# Patient Record
Sex: Male | Born: 1985 | Race: White | Hispanic: No | Marital: Married | Smoking: Current every day smoker
Health system: Southern US, Community
[De-identification: ages and names within clinical notes are randomized; demographics above are authoritative.]

## PROBLEM LIST (undated history)

## (undated) DIAGNOSIS — G43909 Migraine, unspecified, not intractable, without status migrainosus: Secondary | ICD-10-CM

## (undated) DIAGNOSIS — F32A Depression, unspecified: Secondary | ICD-10-CM

## (undated) DIAGNOSIS — F329 Major depressive disorder, single episode, unspecified: Secondary | ICD-10-CM

## (undated) HISTORY — PX: OTHER SURGICAL HISTORY: SHX169

---

## 2004-09-11 ENCOUNTER — Emergency Department (HOSPITAL_COMMUNITY): Admission: EM | Admit: 2004-09-11 | Discharge: 2004-09-11 | Payer: Self-pay | Admitting: Emergency Medicine

## 2005-09-08 ENCOUNTER — Emergency Department (HOSPITAL_COMMUNITY): Admission: EM | Admit: 2005-09-08 | Discharge: 2005-09-08 | Payer: Self-pay | Admitting: Emergency Medicine

## 2006-11-30 ENCOUNTER — Emergency Department (HOSPITAL_COMMUNITY): Admission: EM | Admit: 2006-11-30 | Discharge: 2006-11-30 | Payer: Self-pay | Admitting: Family Medicine

## 2006-12-05 ENCOUNTER — Ambulatory Visit: Payer: Self-pay | Admitting: Critical Care Medicine

## 2007-01-05 ENCOUNTER — Ambulatory Visit: Payer: Self-pay | Admitting: Critical Care Medicine

## 2007-03-29 ENCOUNTER — Emergency Department (HOSPITAL_COMMUNITY): Admission: EM | Admit: 2007-03-29 | Discharge: 2007-03-29 | Payer: Self-pay | Admitting: Emergency Medicine

## 2007-05-31 DIAGNOSIS — Z9189 Other specified personal risk factors, not elsewhere classified: Secondary | ICD-10-CM | POA: Insufficient documentation

## 2007-05-31 DIAGNOSIS — J189 Pneumonia, unspecified organism: Secondary | ICD-10-CM

## 2007-05-31 DIAGNOSIS — F341 Dysthymic disorder: Secondary | ICD-10-CM

## 2008-07-20 ENCOUNTER — Emergency Department (HOSPITAL_BASED_OUTPATIENT_CLINIC_OR_DEPARTMENT_OTHER): Admission: EM | Admit: 2008-07-20 | Discharge: 2008-07-20 | Payer: Self-pay | Admitting: Emergency Medicine

## 2008-10-21 ENCOUNTER — Emergency Department (HOSPITAL_BASED_OUTPATIENT_CLINIC_OR_DEPARTMENT_OTHER): Admission: EM | Admit: 2008-10-21 | Discharge: 2008-10-21 | Payer: Self-pay | Admitting: Emergency Medicine

## 2008-10-28 ENCOUNTER — Ambulatory Visit: Payer: Self-pay | Admitting: Diagnostic Radiology

## 2008-10-28 ENCOUNTER — Emergency Department (HOSPITAL_BASED_OUTPATIENT_CLINIC_OR_DEPARTMENT_OTHER): Admission: EM | Admit: 2008-10-28 | Discharge: 2008-10-28 | Payer: Self-pay | Admitting: Emergency Medicine

## 2009-03-26 ENCOUNTER — Emergency Department (HOSPITAL_BASED_OUTPATIENT_CLINIC_OR_DEPARTMENT_OTHER): Admission: EM | Admit: 2009-03-26 | Discharge: 2009-03-27 | Payer: Self-pay | Admitting: Emergency Medicine

## 2009-03-26 ENCOUNTER — Ambulatory Visit: Payer: Self-pay | Admitting: Diagnostic Radiology

## 2009-07-24 ENCOUNTER — Emergency Department (HOSPITAL_BASED_OUTPATIENT_CLINIC_OR_DEPARTMENT_OTHER): Admission: EM | Admit: 2009-07-24 | Discharge: 2009-07-24 | Payer: Self-pay | Admitting: Emergency Medicine

## 2009-09-10 ENCOUNTER — Emergency Department (HOSPITAL_BASED_OUTPATIENT_CLINIC_OR_DEPARTMENT_OTHER): Admission: EM | Admit: 2009-09-10 | Discharge: 2009-09-11 | Payer: Self-pay | Admitting: Emergency Medicine

## 2009-09-11 ENCOUNTER — Ambulatory Visit: Payer: Self-pay | Admitting: Diagnostic Radiology

## 2010-04-29 ENCOUNTER — Emergency Department (HOSPITAL_COMMUNITY)
Admission: EM | Admit: 2010-04-29 | Discharge: 2010-04-29 | Payer: Self-pay | Source: Home / Self Care | Admitting: Emergency Medicine

## 2010-05-24 ENCOUNTER — Encounter: Payer: Self-pay | Admitting: Specialist

## 2010-07-13 LAB — RAPID STREP SCREEN (MED CTR MEBANE ONLY): Streptococcus, Group A Screen (Direct): NEGATIVE

## 2010-07-21 LAB — CBC
MCV: 89.8 fL (ref 78.0–100.0)
RBC: 5.19 MIL/uL (ref 4.22–5.81)
WBC: 14.1 10*3/uL — ABNORMAL HIGH (ref 4.0–10.5)

## 2010-07-21 LAB — DIFFERENTIAL
Basophils Absolute: 0 10*3/uL (ref 0.0–0.1)
Eosinophils Absolute: 0.1 10*3/uL (ref 0.0–0.7)
Eosinophils Relative: 1 % (ref 0–5)
Lymphocytes Relative: 12 % (ref 12–46)
Lymphs Abs: 1.7 10*3/uL (ref 0.7–4.0)
Monocytes Relative: 5 % (ref 3–12)
Neutrophils Relative %: 82 % — ABNORMAL HIGH (ref 43–77)

## 2010-07-21 LAB — D-DIMER, QUANTITATIVE: D-Dimer, Quant: <0.22 ug/mL-FEU (ref 0.00–0.48)

## 2010-07-21 LAB — BASIC METABOLIC PANEL
BUN: 11 mg/dL (ref 6–23)
CO2: 26 mEq/L (ref 19–32)
GFR calc Af Amer: >60 mL/min (ref >60)
Glucose, Bld: 103 mg/dL — ABNORMAL HIGH (ref 70–99)
Sodium: 143 mEq/L (ref 135–145)

## 2010-07-21 LAB — LITHIUM LEVEL: Lithium Lvl: <0.25 mEq/L — ABNORMAL LOW (ref 0.80–1.40)

## 2010-08-07 ENCOUNTER — Emergency Department (INDEPENDENT_AMBULATORY_CARE_PROVIDER_SITE_OTHER): Payer: Self-pay

## 2010-08-07 ENCOUNTER — Emergency Department (HOSPITAL_BASED_OUTPATIENT_CLINIC_OR_DEPARTMENT_OTHER)
Admission: EM | Admit: 2010-08-07 | Discharge: 2010-08-08 | Disposition: A | Discharge status: Home / Self Care | Payer: Self-pay | Attending: Emergency Medicine | Admitting: Emergency Medicine

## 2010-08-07 DIAGNOSIS — F17200 Nicotine dependence, unspecified, uncomplicated: Secondary | ICD-10-CM | POA: Insufficient documentation

## 2010-08-07 DIAGNOSIS — R51 Headache: Secondary | ICD-10-CM

## 2010-08-16 ENCOUNTER — Emergency Department (INDEPENDENT_AMBULATORY_CARE_PROVIDER_SITE_OTHER): Payer: Self-pay

## 2010-08-16 ENCOUNTER — Emergency Department (HOSPITAL_BASED_OUTPATIENT_CLINIC_OR_DEPARTMENT_OTHER)
Admission: EM | Admit: 2010-08-16 | Discharge: 2010-08-16 | Disposition: A | Discharge status: Home / Self Care | Payer: Self-pay | Attending: Emergency Medicine | Admitting: Emergency Medicine

## 2010-08-16 DIAGNOSIS — F17200 Nicotine dependence, unspecified, uncomplicated: Secondary | ICD-10-CM | POA: Insufficient documentation

## 2010-08-16 DIAGNOSIS — IMO0002 Reserved for concepts with insufficient information to code with codable children: Secondary | ICD-10-CM

## 2010-08-16 DIAGNOSIS — R071 Chest pain on breathing: Secondary | ICD-10-CM

## 2010-08-16 DIAGNOSIS — S20219A Contusion of unspecified front wall of thorax, initial encounter: Secondary | ICD-10-CM | POA: Insufficient documentation

## 2010-08-16 DIAGNOSIS — Y9364 Activity, baseball: Secondary | ICD-10-CM | POA: Insufficient documentation

## 2010-08-16 DIAGNOSIS — W219XXA Striking against or struck by unspecified sports equipment, initial encounter: Secondary | ICD-10-CM | POA: Insufficient documentation

## 2010-08-24 ENCOUNTER — Emergency Department (INDEPENDENT_AMBULATORY_CARE_PROVIDER_SITE_OTHER): Payer: Self-pay

## 2010-08-24 ENCOUNTER — Emergency Department (HOSPITAL_BASED_OUTPATIENT_CLINIC_OR_DEPARTMENT_OTHER)
Admission: EM | Admit: 2010-08-24 | Discharge: 2010-08-24 | Disposition: A | Discharge status: Home / Self Care | Payer: Self-pay | Attending: Emergency Medicine | Admitting: Emergency Medicine

## 2010-08-24 DIAGNOSIS — R079 Chest pain, unspecified: Secondary | ICD-10-CM | POA: Insufficient documentation

## 2010-08-24 DIAGNOSIS — F17200 Nicotine dependence, unspecified, uncomplicated: Secondary | ICD-10-CM | POA: Insufficient documentation
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
## 2010-09-15 NOTE — Assessment & Plan Note (Signed)
[facility]                             PULMONARY OFFICE NOTE   Donald Serrano, Donald Serrano                     MRN:          [id number]  DATE:01/05/2007                            DOB:          04/06/1986    Donald Serrano returns in followup.  This 25 year old male smoker with  right lower lobe pneumonia.  He has improved with his breathing but  still is smoking actively.  He has no cough or congestion.  Does note  increased anxiety attacks with spells of crying and depression.   PHYSICAL EXAMINATION:  Temp 97.9, blood pressure 118/78, pulse 88,  saturation 99% on room air.  CHEST:  Completely clear without evidence of adventitious breath sounds.  There was no evidence of wheeze, rales, or rhonchi noted.  CARDIAC:  Regular rate and rhythm without S3.  Normal S1 and S2.  ABDOMEN:  Soft, nontender.  EXTREMITIES:  No clubbing or edema.   Chest x-ray obtained today showed clearance of right lower lobe  infiltrate.   IMPRESSION:  Chronic active smoking, previous pneumonia, now resolved.   PLAN:  Pursue smoking cessation with Nicotrol inhaler.  He will also  receive Lexapro for active depression, 10 mg daily, and will refer the  patient to primary care to establish.     Charlcie Cradle Delford Field, MD, [facility]  Electronically Signed    PEW/MedQ  DD: 01/05/2007  DT: 01/05/2007  Job #: [id number]

## 2011-02-15 LAB — POCT URINALYSIS DIP (DEVICE)
Hgb urine dipstick: NEGATIVE
Ketones, ur: NEGATIVE
Operator id: 247071
Protein, ur: NEGATIVE
Specific Gravity, Urine: 1.015
Urobilinogen, UA: 1
pH: 8.5 — ABNORMAL HIGH

## 2011-02-15 LAB — CBC
Hemoglobin: 16.5
MCHC: 33.6
MCV: 88.6
RBC: 5.54
WBC: 7.6

## 2011-02-15 LAB — I-STAT 8, (EC8 V) (CONVERTED LAB)
BUN: 11
Bicarbonate: 27.3 — ABNORMAL HIGH
Chloride: 105
Glucose, Bld: 88
HCT: 53 — ABNORMAL HIGH
pCO2, Ven: 44.1 — ABNORMAL LOW
pH, Ven: 7.4 — ABNORMAL HIGH

## 2011-02-15 LAB — DIFFERENTIAL
Basophils Relative: 0
Eosinophils Absolute: 0.1
Lymphs Abs: 1.8
Monocytes Absolute: 0.6
Monocytes Relative: 8
Neutrophils Relative %: 68

## 2011-02-15 LAB — POCT I-STAT CREATININE: Creatinine, Ser: 0.9

## 2011-11-13 ENCOUNTER — Emergency Department (HOSPITAL_COMMUNITY)
Admission: EM | Admit: 2011-11-13 | Discharge: 2011-11-14 | Disposition: A | Discharge status: Home / Self Care | Payer: Self-pay | Attending: Emergency Medicine | Admitting: Emergency Medicine

## 2011-11-13 ENCOUNTER — Encounter (HOSPITAL_COMMUNITY): Payer: Self-pay | Admitting: Emergency Medicine

## 2011-11-13 ENCOUNTER — Emergency Department (HOSPITAL_COMMUNITY): Payer: Self-pay

## 2011-11-13 DIAGNOSIS — L03039 Cellulitis of unspecified toe: Secondary | ICD-10-CM | POA: Insufficient documentation

## 2011-11-13 DIAGNOSIS — L02619 Cutaneous abscess of unspecified foot: Secondary | ICD-10-CM | POA: Insufficient documentation

## 2011-11-13 NOTE — ED Notes (Signed)
Pt states he had a had a bump on his toe several days ago. Pt states he popped it. Second digit on right foot is red and swollen with puss draining. Pt pain 10/10.

## 2011-11-14 MED ORDER — OXYCODONE-ACETAMINOPHEN 5-325 MG PO TABS
1.0000 | ORAL_TABLET | Freq: Once | ORAL | Status: AC
  Administered 2011-11-14: 1 via ORAL
  Filled 2011-11-14: qty 1

## 2011-11-14 MED ORDER — SULFAMETHOXAZOLE-TRIMETHOPRIM 800-160 MG PO TABS: 1.0000 | ORAL_TABLET | Freq: Two times a day (BID) | ORAL | Status: DC

## 2011-11-14 MED ORDER — OXYCODONE-ACETAMINOPHEN 5-325 MG PO TABS: 1.0000 | ORAL_TABLET | ORAL | Status: DC | PRN

## 2011-11-14 NOTE — ED Notes (Signed)
Patient given discharge instructions, information, prescriptions, and diet order. Patient states that they adequately understand discharge information given and to return to ED if symptoms return or worsen.     

## 2011-11-14 NOTE — ED Provider Notes (Signed)
History     CSN: [id number]  Arrival date & time 11/13/11  2135   First MD Initiated Contact with Patient 11/14/11 0117      Chief Complaint  Patient presents with  . Foot Pain    (Consider location/radiation/quality/duration/timing/severity/associated sxs/prior treatment) HPI Comments: Donald Serrano first started noticing redness, swelling, and pain in the 3rd toe of his left foot 1 week ago. The pain is worsened by touch and weightbearing. He noticed some drainage from the site 3 days ago. He notes that his father in law, whom he lives with, recently had a similar lesion in his armpit. He denies fever, chills, and malaise. He denies trauma to his foot and exposure to insects. He denies numbness and tingling in his left foot and toes.  His most recent tetanus vaccination was in 2010.  The history is provided by the patient.    No past medical history on file.  No past surgical history on file.  No family history on file.  History  Substance Use Topics  . Smoking status: Current Everyday Smoker -- 2.0 packs/day  . Smokeless tobacco: Not on file  . Alcohol Use: No      Review of Systems  Constitutional: Negative for fever and chills.  Neurological: Negative for weakness and numbness.    Allergies  Review of patient's allergies indicates no known allergies.  Home Medications  No current outpatient prescriptions on file.  BP 118/83  Pulse 106  Temp 99 F (37.2 C) (Oral)  Resp 20  SpO2 100%  Physical Exam  Nursing note and vitals reviewed. Constitutional: He is oriented to person, place, and time. He appears well-developed and well-nourished. No distress.  HENT:  Head: Normocephalic and atraumatic.  Neck: Neck supple.  Pulmonary/Chest: Effort normal.  Musculoskeletal:       Feet:       Pt able to move toe though decreased due to swelling and pain.  Capillary refill < 2 seconds.  Others digits with full AROM.  No erythema extending past the toe.      Neurological: He is alert and oriented to person, place, and time.  Skin: He is not diaphoretic.    ED Course  Procedures (including critical care time)  Labs Reviewed - No data to display Dg Foot Complete Right  11/13/2011  *RADIOLOGY REPORT*  Clinical Data: Left foot swelling and redness.  No known injury.  RIGHT FOOT COMPLETE - 3+ VIEW  Comparison: None.  Findings: No fracture or dislocation.  No aggressive osseous lesion.  No radiopaque foreign body.  Lisfranc joint appears intact. There does appear to be mild dorsal and lateral soft tissue swelling.  IMPRESSION: No acute osseous abnormality identified. Mild soft tissue swelling.  Original Report Authenticated By: Waneta Martins, M.D.   Patient also seen and examined by Dr Bebe Shaggy.  Discussed plan with Dr Bebe Shaggy.   1. Cellulitis of toe       MDM  Pt with cellulitis/abscess of toe, actively draining.  Pt is nontoxic, afebrile.  Pt d/c home with bactrim, percocet, instructions for warm soaks, ED follow up in 2 days, return precautions given.  Patient verbalizes understanding and agrees with plan.         Donald Serrano, [place] 11/14/11 [phone]

## 2011-11-14 NOTE — ED Notes (Signed)
Pt sts on Sunday 11/07/11 he felt chills and generalized weakness, took bath and noticed red raised area on his right 2nd digit Monday. Sts Wednesday he popped the bump and there has been blood and pus leaking from the toe since. Toe is red, swollen and painful to the touch.

## 2011-11-16 ENCOUNTER — Emergency Department (HOSPITAL_COMMUNITY)
Admission: EM | Admit: 2011-11-16 | Discharge: 2011-11-16 | Disposition: A | Discharge status: Home / Self Care | Payer: Self-pay | Attending: Emergency Medicine | Admitting: Emergency Medicine

## 2011-11-16 ENCOUNTER — Encounter (HOSPITAL_COMMUNITY): Payer: Self-pay | Admitting: Emergency Medicine

## 2011-11-16 DIAGNOSIS — L03032 Cellulitis of left toe: Secondary | ICD-10-CM

## 2011-11-16 DIAGNOSIS — L02619 Cutaneous abscess of unspecified foot: Secondary | ICD-10-CM | POA: Insufficient documentation

## 2011-11-16 DIAGNOSIS — F17200 Nicotine dependence, unspecified, uncomplicated: Secondary | ICD-10-CM | POA: Insufficient documentation

## 2011-11-16 DIAGNOSIS — L03039 Cellulitis of unspecified toe: Secondary | ICD-10-CM | POA: Insufficient documentation
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
## 2011-11-16 NOTE — ED Notes (Signed)
Pt was seen here on Saturday and was told he had a cyst on his left foot 2nd toe  Pt was placed on antibiotics and told to come back for re evaluation today  Pt states he thinks it looks better and the redness has decreased but it continues to drain

## 2011-11-16 NOTE — ED Provider Notes (Signed)
Medical screening examination/treatment/procedure(s) were conducted as a shared visit with non-physician practitioner(s) and myself.  I personally evaluated the patient during the encounter  Pt well appearing, no distress, likely abscess, has full ROM of toe  Joya Gaskins, MD 11/16/11 1958

## 2011-11-16 NOTE — ED Notes (Signed)
Patient discharge home with a steady gait. Respirations equal and unlabored. Skin warm and dry. No acute distress noted. 

## 2011-11-26 NOTE — ED Provider Notes (Signed)
Medical screening examination/treatment/procedure(s) were performed by non-physician practitioner and as supervising physician I was immediately available for consultation/collaboration.  Rishika Mccollom K Pete Merten-Rasch, MD 11/26/11 2312 

## 2012-08-17 ENCOUNTER — Emergency Department (HOSPITAL_COMMUNITY)
Admission: EM | Admit: 2012-08-17 | Discharge: 2012-08-17 | Disposition: A | Discharge status: Home / Self Care | Payer: Self-pay | Attending: Emergency Medicine | Admitting: Emergency Medicine

## 2012-08-17 ENCOUNTER — Encounter (HOSPITAL_COMMUNITY): Payer: Self-pay | Admitting: Emergency Medicine

## 2012-08-17 ENCOUNTER — Emergency Department (HOSPITAL_COMMUNITY): Payer: Self-pay

## 2012-08-17 DIAGNOSIS — M549 Dorsalgia, unspecified: Secondary | ICD-10-CM | POA: Insufficient documentation

## 2012-08-17 DIAGNOSIS — F17200 Nicotine dependence, unspecified, uncomplicated: Secondary | ICD-10-CM | POA: Insufficient documentation

## 2012-08-17 LAB — URINALYSIS, ROUTINE W REFLEX MICROSCOPIC
Bilirubin Urine: NEGATIVE
Glucose, UA: NEGATIVE mg/dL
Leukocytes, UA: NEGATIVE
Nitrite: NEGATIVE
Specific Gravity, Urine: 1.022 (ref 1.005–1.030)
pH: 6 (ref 5.0–8.0)

## 2012-08-17 MED ORDER — HYDROCODONE-ACETAMINOPHEN 5-325 MG PO TABS: 1.0000 | ORAL_TABLET | Freq: Four times a day (QID) | ORAL | Status: DC | PRN

## 2012-08-17 MED ORDER — OXYCODONE-ACETAMINOPHEN 5-325 MG PO TABS
2.0000 | ORAL_TABLET | Freq: Once | ORAL | Status: AC
  Administered 2012-08-17: 2 via ORAL
  Filled 2012-08-17: qty 2

## 2012-08-17 MED ORDER — METHOCARBAMOL 500 MG PO TABS: 500.0000 mg | ORAL_TABLET | Freq: Two times a day (BID) | ORAL | Status: DC

## 2012-08-17 MED ORDER — NAPROXEN 500 MG PO TABS: 500.0000 mg | ORAL_TABLET | Freq: Two times a day (BID) | ORAL | Status: DC

## 2012-08-17 NOTE — ED Notes (Addendum)
Pt refused lab work.

## 2012-08-17 NOTE — ED Notes (Signed)
Pt states he woke up with back pain Sunday, has been constant, pain is sharp/shooting and radiates down right leg and to right testicle.

## 2012-08-17 NOTE — ED Provider Notes (Signed)
Medical screening examination/treatment/procedure(s) were performed by non-physician practitioner and as supervising physician I was immediately available for consultation/collaboration.   Refugio Vandevoorde, MD 08/17/12 1449 
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
## 2012-08-17 NOTE — ED Notes (Signed)
Pt given urinal.

## 2012-08-29 ENCOUNTER — Emergency Department (HOSPITAL_BASED_OUTPATIENT_CLINIC_OR_DEPARTMENT_OTHER): Payer: Self-pay

## 2012-08-29 ENCOUNTER — Encounter (HOSPITAL_BASED_OUTPATIENT_CLINIC_OR_DEPARTMENT_OTHER): Payer: Self-pay | Admitting: *Deleted

## 2012-08-29 ENCOUNTER — Emergency Department (HOSPITAL_BASED_OUTPATIENT_CLINIC_OR_DEPARTMENT_OTHER)
Admission: EM | Admit: 2012-08-29 | Discharge: 2012-08-29 | Disposition: A | Discharge status: Home / Self Care | Payer: Self-pay | Attending: Emergency Medicine | Admitting: Emergency Medicine

## 2012-08-29 DIAGNOSIS — F17200 Nicotine dependence, unspecified, uncomplicated: Secondary | ICD-10-CM | POA: Insufficient documentation

## 2012-08-29 DIAGNOSIS — Y9372 Activity, wrestling: Secondary | ICD-10-CM | POA: Insufficient documentation

## 2012-08-29 DIAGNOSIS — W230XXA Caught, crushed, jammed, or pinched between moving objects, initial encounter: Secondary | ICD-10-CM | POA: Insufficient documentation

## 2012-08-29 DIAGNOSIS — S63601A Unspecified sprain of right thumb, initial encounter: Secondary | ICD-10-CM

## 2012-08-29 DIAGNOSIS — S6390XA Sprain of unspecified part of unspecified wrist and hand, initial encounter: Secondary | ICD-10-CM | POA: Insufficient documentation

## 2012-08-29 DIAGNOSIS — Y929 Unspecified place or not applicable: Secondary | ICD-10-CM | POA: Insufficient documentation

## 2012-08-29 MED ORDER — OXYCODONE-ACETAMINOPHEN 5-325 MG PO TABS
2.0000 | ORAL_TABLET | Freq: Once | ORAL | Status: AC
  Administered 2012-08-29: 2 via ORAL
  Filled 2012-08-29 (×2): qty 2

## 2012-08-29 MED ORDER — TRAMADOL HCL 50 MG PO TABS: 50.0000 mg | ORAL_TABLET | Freq: Four times a day (QID) | ORAL | Status: DC | PRN

## 2012-08-29 MED ORDER — IBUPROFEN 600 MG PO TABS: 600.0000 mg | ORAL_TABLET | Freq: Four times a day (QID) | ORAL | Status: DC | PRN

## 2012-08-29 NOTE — ED Notes (Signed)
Pt amb to room 9 with quick steady gait in nad. Pt reports he was wrestling with his brother last night and "my thumb got jammed". Pain and swelling noted to right thumb, pt states pain radiates to wrist area. Denies any other injuries or c/o.
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
## 2012-12-25 ENCOUNTER — Emergency Department (HOSPITAL_COMMUNITY)
Admission: EM | Admit: 2012-12-25 | Discharge: 2012-12-25 | Disposition: A | Discharge status: Home / Self Care | Payer: Self-pay | Attending: Emergency Medicine | Admitting: Emergency Medicine

## 2012-12-25 ENCOUNTER — Encounter (HOSPITAL_COMMUNITY): Payer: Self-pay | Admitting: Emergency Medicine

## 2012-12-25 DIAGNOSIS — L0291 Cutaneous abscess, unspecified: Secondary | ICD-10-CM

## 2012-12-25 DIAGNOSIS — L02419 Cutaneous abscess of limb, unspecified: Secondary | ICD-10-CM | POA: Insufficient documentation

## 2012-12-25 DIAGNOSIS — Z8659 Personal history of other mental and behavioral disorders: Secondary | ICD-10-CM | POA: Insufficient documentation

## 2012-12-25 DIAGNOSIS — F17200 Nicotine dependence, unspecified, uncomplicated: Secondary | ICD-10-CM | POA: Insufficient documentation

## 2012-12-25 HISTORY — DX: Depression, unspecified: F32.A

## 2012-12-25 HISTORY — DX: Major depressive disorder, single episode, unspecified: F32.9

## 2012-12-25 MED ORDER — CEPHALEXIN 500 MG PO CAPS: 500.0000 mg | ORAL_CAPSULE | Freq: Four times a day (QID) | ORAL | Status: DC

## 2012-12-25 MED ORDER — OXYCODONE-ACETAMINOPHEN 5-325 MG PO TABS
1.0000 | ORAL_TABLET | Freq: Once | ORAL | Status: AC
Start: 2012-12-25 — End: 2012-12-25
  Administered 2012-12-25: 1 via ORAL
  Filled 2012-12-25: qty 1

## 2012-12-25 MED ORDER — HYDROCODONE-ACETAMINOPHEN 5-325 MG PO TABS: 1.0000 | ORAL_TABLET | Freq: Four times a day (QID) | ORAL | Status: DC | PRN

## 2012-12-25 MED ORDER — SULFAMETHOXAZOLE-TRIMETHOPRIM 800-160 MG PO TABS: 2.0000 | ORAL_TABLET | Freq: Two times a day (BID) | ORAL | Status: DC

## 2012-12-25 MED ORDER — TRAMADOL HCL 50 MG PO TABS: 50.0000 mg | ORAL_TABLET | Freq: Four times a day (QID) | ORAL | Status: DC | PRN

## 2012-12-25 NOTE — ED Notes (Signed)
Left calf  Bump started yesterday he mased and now stuff is coming out area is red swollen
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
## 2012-12-27 NOTE — ED Provider Notes (Signed)
Medical screening examination/treatment/procedure(s) were performed by non-physician practitioner and as supervising physician I was immediately available for consultation/collaboration.   Laray Anger, DO 12/27/12 2047

## 2013-04-19 ENCOUNTER — Emergency Department (HOSPITAL_COMMUNITY)
Admission: EM | Admit: 2013-04-19 | Discharge: 2013-04-19 | Disposition: A | Discharge status: Home / Self Care | Payer: Self-pay | Attending: Emergency Medicine | Admitting: Emergency Medicine

## 2013-04-19 ENCOUNTER — Emergency Department (HOSPITAL_COMMUNITY): Payer: Self-pay

## 2013-04-19 ENCOUNTER — Encounter (HOSPITAL_COMMUNITY): Payer: Self-pay | Admitting: Emergency Medicine

## 2013-04-19 DIAGNOSIS — Y990 Civilian activity done for income or pay: Secondary | ICD-10-CM | POA: Insufficient documentation

## 2013-04-19 DIAGNOSIS — W1789XA Other fall from one level to another, initial encounter: Secondary | ICD-10-CM | POA: Insufficient documentation

## 2013-04-19 DIAGNOSIS — M542 Cervicalgia: Secondary | ICD-10-CM

## 2013-04-19 DIAGNOSIS — Z792 Long term (current) use of antibiotics: Secondary | ICD-10-CM | POA: Insufficient documentation

## 2013-04-19 DIAGNOSIS — Z8659 Personal history of other mental and behavioral disorders: Secondary | ICD-10-CM | POA: Insufficient documentation

## 2013-04-19 DIAGNOSIS — F29 Unspecified psychosis not due to a substance or known physiological condition: Secondary | ICD-10-CM | POA: Insufficient documentation

## 2013-04-19 DIAGNOSIS — W1809XA Striking against other object with subsequent fall, initial encounter: Secondary | ICD-10-CM | POA: Insufficient documentation

## 2013-04-19 DIAGNOSIS — S0993XA Unspecified injury of face, initial encounter: Secondary | ICD-10-CM | POA: Insufficient documentation

## 2013-04-19 DIAGNOSIS — F17200 Nicotine dependence, unspecified, uncomplicated: Secondary | ICD-10-CM | POA: Insufficient documentation

## 2013-04-19 DIAGNOSIS — W14XXXA Fall from tree, initial encounter: Secondary | ICD-10-CM

## 2013-04-19 DIAGNOSIS — S43102A Unspecified dislocation of left acromioclavicular joint, initial encounter: Secondary | ICD-10-CM

## 2013-04-19 DIAGNOSIS — Y929 Unspecified place or not applicable: Secondary | ICD-10-CM | POA: Insufficient documentation

## 2013-04-19 DIAGNOSIS — IMO0002 Reserved for concepts with insufficient information to code with codable children: Secondary | ICD-10-CM | POA: Insufficient documentation

## 2013-04-19 DIAGNOSIS — M549 Dorsalgia, unspecified: Secondary | ICD-10-CM

## 2013-04-19 DIAGNOSIS — S43109A Unspecified dislocation of unspecified acromioclavicular joint, initial encounter: Secondary | ICD-10-CM | POA: Insufficient documentation

## 2013-04-19 DIAGNOSIS — Y9389 Activity, other specified: Secondary | ICD-10-CM | POA: Insufficient documentation

## 2013-04-19 MED ORDER — DIAZEPAM 5 MG PO TABS: 5.0000 mg | ORAL_TABLET | Freq: Three times a day (TID) | ORAL | Status: DC | PRN

## 2013-04-19 MED ORDER — DIAZEPAM 5 MG PO TABS
5.0000 mg | ORAL_TABLET | Freq: Once | ORAL | Status: AC
  Administered 2013-04-19: 5 mg via ORAL
  Filled 2013-04-19: qty 1

## 2013-04-19 MED ORDER — OXYCODONE-ACETAMINOPHEN 5-325 MG PO TABS
2.0000 | ORAL_TABLET | Freq: Once | ORAL | Status: AC
  Administered 2013-04-19: 2 via ORAL
  Filled 2013-04-19: qty 2

## 2013-04-19 MED ORDER — OXYCODONE-ACETAMINOPHEN 5-325 MG PO TABS
1.0000 | ORAL_TABLET | Freq: Once | ORAL | Status: AC
  Administered 2013-04-19: 1 via ORAL
  Filled 2013-04-19: qty 1

## 2013-04-19 MED ORDER — OXYCODONE-ACETAMINOPHEN 5-325 MG PO TABS: 1.0000 | ORAL_TABLET | ORAL | Status: DC | PRN
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
## 2013-04-19 NOTE — ED Notes (Signed)
Pt states he fell off 8' ladder yesterday while cutting down a tree. Hit branches on way down. Landed on back/buttock. Pt complains of back/neck pain. Difficulty moving neck to the side and pain/stiffness all over. No LOC.

## 2013-04-19 NOTE — Progress Notes (Signed)
Pt was pulled out to adminster contrast. Pt at this point refused the contrast. Pt was stressed the importance of the post contrast imaging. By me as well as sarah duggins and the mri supervisor [employer]. 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 Dr Isabel Caprice the neuro-radiologist was notified of the contrast refusal.

## 2013-04-19 NOTE — ED Notes (Addendum)
Pt express concerns related to payment of MRI. Registration to room. Social Work paged.

## 2013-04-19 NOTE — ED Notes (Signed)
Patient transported to X-ray 

## 2013-04-20 NOTE — ED Provider Notes (Signed)
Medical screening examination/treatment/procedure(s) were performed by non-physician practitioner and as supervising physician I was immediately available for consultation/collaboration.  EKG Interpretation   None          Gilda Crease, MD 04/20/13 [phone]

## 2013-09-03 ENCOUNTER — Encounter (HOSPITAL_BASED_OUTPATIENT_CLINIC_OR_DEPARTMENT_OTHER): Payer: Self-pay | Admitting: Emergency Medicine

## 2013-09-03 ENCOUNTER — Emergency Department (HOSPITAL_BASED_OUTPATIENT_CLINIC_OR_DEPARTMENT_OTHER)
Admission: EM | Admit: 2013-09-03 | Discharge: 2013-09-03 | Disposition: A | Discharge status: Home / Self Care | Payer: Self-pay | Attending: Emergency Medicine | Admitting: Emergency Medicine

## 2013-09-03 DIAGNOSIS — Z8659 Personal history of other mental and behavioral disorders: Secondary | ICD-10-CM | POA: Insufficient documentation

## 2013-09-03 DIAGNOSIS — R51 Headache: Secondary | ICD-10-CM

## 2013-09-03 DIAGNOSIS — R519 Headache, unspecified: Secondary | ICD-10-CM

## 2013-09-03 DIAGNOSIS — G43909 Migraine, unspecified, not intractable, without status migrainosus: Secondary | ICD-10-CM | POA: Insufficient documentation

## 2013-09-03 DIAGNOSIS — F17200 Nicotine dependence, unspecified, uncomplicated: Secondary | ICD-10-CM | POA: Insufficient documentation

## 2013-09-03 DIAGNOSIS — Z79899 Other long term (current) drug therapy: Secondary | ICD-10-CM | POA: Insufficient documentation

## 2013-09-03 DIAGNOSIS — Z791 Long term (current) use of non-steroidal anti-inflammatories (NSAID): Secondary | ICD-10-CM | POA: Insufficient documentation

## 2013-09-03 HISTORY — DX: Migraine, unspecified, not intractable, without status migrainosus: G43.909

## 2013-09-03 MED ORDER — KETOROLAC TROMETHAMINE 30 MG/ML IJ SOLN: 30.0000 mg | Freq: Once | INTRAMUSCULAR | Status: DC

## 2013-09-03 MED ORDER — ACETAMINOPHEN-CODEINE #3 300-30 MG PO TABS: 1.0000 | ORAL_TABLET | Freq: Four times a day (QID) | ORAL | Status: DC | PRN

## 2013-09-03 MED ORDER — DIPHENHYDRAMINE HCL 50 MG/ML IJ SOLN: 25.0000 mg | Freq: Once | INTRAMUSCULAR | Status: DC

## 2013-09-03 MED ORDER — METOCLOPRAMIDE HCL 5 MG/ML IJ SOLN: 10.0000 mg | Freq: Once | INTRAMUSCULAR | Status: DC

## 2013-09-03 MED ORDER — METHYLPREDNISOLONE SODIUM SUCC 125 MG IJ SOLR: 125.0000 mg | Freq: Once | INTRAMUSCULAR | Status: DC

## 2013-09-03 MED ORDER — TRAMADOL HCL 50 MG PO TABS: 50.0000 mg | ORAL_TABLET | Freq: Four times a day (QID) | ORAL | Status: DC | PRN

## 2013-09-03 MED ORDER — SUMATRIPTAN SUCCINATE 6 MG/0.5ML ~~LOC~~ SOLN
6.0000 mg | Freq: Once | SUBCUTANEOUS | Status: AC
  Administered 2013-09-03: 6 mg via SUBCUTANEOUS
  Filled 2013-09-03: qty 0.5

## 2013-09-03 MED ORDER — SODIUM CHLORIDE 0.9 % IV BOLUS (SEPSIS): 1000.0000 mL | Freq: Once | INTRAVENOUS | Status: DC
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
## 2013-09-03 NOTE — Discharge Instructions (Signed)
Tylenol 3 as needed for pain.  Return to the ER if you develop worsening headache, visual changes, or any other new and concerning symptoms.   Headaches, Frequently Asked Questions MIGRAINE HEADACHES Q: What is migraine? What causes it? How can I treat it? A: Generally, migraine headaches begin as a dull ache. Then they develop into a constant, throbbing, and pulsating pain. You may experience pain at the temples. You may experience pain at the front or back of one or both sides of the head. The pain is usually accompanied by a combination of:  Nausea.  Vomiting.  Sensitivity to light and noise. Some people (about 15%) experience an aura (see below) before an attack. The cause of migraine is believed to be chemical reactions in the brain. Treatment for migraine may include over-the-counter or prescription medications. It may also include self-help techniques. These include relaxation training and biofeedback.  Q: What is an aura? A: About 15% of people with migraine get an "aura". This is a sign of neurological symptoms that occur before a migraine headache. You may see wavy or jagged lines, dots, or flashing lights. You might experience tunnel vision or blind spots in one or both eyes. The aura can include visual or auditory hallucinations (something imagined). It may include disruptions in smell (such as strange odors), taste or touch. Other symptoms include:  Numbness.  A "pins and needles" sensation.  Difficulty in recalling or speaking the correct word. These neurological events may last as long as 60 minutes. These symptoms will fade as the headache begins. Q: What is a trigger? A: Certain physical or environmental factors can lead to or "trigger" a migraine. These include:  Foods.  Hormonal changes.  Weather.  Stress. It is important to remember that triggers are different for everyone. To help prevent migraine attacks, you need to figure out which triggers affect you. Keep  a headache diary. This is a good way to track triggers. The diary will help you talk to your healthcare professional about your condition. Q: Does weather affect migraines? A: Bright sunshine, hot, humid conditions, and drastic changes in barometric pressure may lead to, or "trigger," a migraine attack in some people. But studies have shown that weather does not act as a trigger for everyone with migraines. Q: What is the link between migraine and hormones? A: Hormones start and regulate many of your body's functions. Hormones keep your body in balance within a constantly changing environment. The levels of hormones in your body are unbalanced at times. Examples are during menstruation, pregnancy, or menopause. That can lead to a migraine attack. In fact, about three quarters of all women with migraine report that their attacks are related to the menstrual cycle.  Q: Is there an increased risk of stroke for migraine sufferers? A: The likelihood of a migraine attack causing a stroke is very remote. That is not to say that migraine sufferers cannot have a stroke associated with their migraines. In persons under age 77, the most common associated factor for stroke is migraine headache. But over the course of a person's normal life span, the occurrence of migraine headache may actually be associated with a reduced risk of dying from cerebrovascular disease due to stroke.  Q: What are acute medications for migraine? A: Acute medications are used to treat the pain of the headache after it has started. Examples over-the-counter medications, NSAIDs, ergots, and triptans.  Q: What are the triptans? A: Triptans are the newest class of abortive medications. They  are specifically targeted to treat migraine. Triptans are vasoconstrictors. They moderate some chemical reactions in the brain. The triptans work on receptors in your brain. Triptans help to restore the balance of a neurotransmitter called serotonin.  Fluctuations in levels of serotonin are thought to be a main cause of migraine.  Q: Are over-the-counter medications for migraine effective? A: Over-the-counter, or "OTC," medications may be effective in relieving mild to moderate pain and associated symptoms of migraine. But you should see your caregiver before beginning any treatment regimen for migraine.  Q: What are preventive medications for migraine? A: Preventive medications for migraine are sometimes referred to as "prophylactic" treatments. They are used to reduce the frequency, severity, and length of migraine attacks. Examples of preventive medications include antiepileptic medications, antidepressants, beta-blockers, calcium channel blockers, and NSAIDs (nonsteroidal anti-inflammatory drugs). Q: Why are anticonvulsants used to treat migraine? A: During the past few years, there has been an increased interest in antiepileptic drugs for the prevention of migraine. They are sometimes referred to as "anticonvulsants". Both epilepsy and migraine may be caused by similar reactions in the brain.  Q: Why are antidepressants used to treat migraine? A: Antidepressants are typically used to treat people with depression. They may reduce migraine frequency by regulating chemical levels, such as serotonin, in the brain.  Q: What alternative therapies are used to treat migraine? A: The term "alternative therapies" is often used to describe treatments considered outside the scope of conventional Western medicine. Examples of alternative therapy include acupuncture, acupressure, and yoga. Another common alternative treatment is herbal therapy. Some herbs are believed to relieve headache pain. Always discuss alternative therapies with your caregiver before proceeding. Some herbal products contain arsenic and other toxins. TENSION HEADACHES Q: What is a tension-type headache? What causes it? How can I treat it? A: Tension-type headaches occur randomly. They  are often the result of temporary stress, anxiety, fatigue, or anger. Symptoms include soreness in your temples, a tightening band-like sensation around your head (a "vice-like" ache). Symptoms can also include a pulling feeling, pressure sensations, and contracting head and neck muscles. The headache begins in your forehead, temples, or the back of your head and neck. Treatment for tension-type headache may include over-the-counter or prescription medications. Treatment may also include self-help techniques such as relaxation training and biofeedback. CLUSTER HEADACHES Q: What is a cluster headache? What causes it? How can I treat it? A: Cluster headache gets its name because the attacks come in groups. The pain arrives with little, if any, warning. It is usually on one side of the head. A tearing or bloodshot eye and a runny nose on the same side of the headache may also accompany the pain. Cluster headaches are believed to be caused by chemical reactions in the brain. They have been described as the most severe and intense of any headache type. Treatment for cluster headache includes prescription medication and oxygen. SINUS HEADACHES Q: What is a sinus headache? What causes it? How can I treat it? A: When a cavity in the bones of the face and skull (a sinus) becomes inflamed, the inflammation will cause localized pain. This condition is usually the result of an allergic reaction, a tumor, or an infection. If your headache is caused by a sinus blockage, such as an infection, you will probably have a fever. An x-ray will confirm a sinus blockage. Your caregiver's treatment might include antibiotics for the infection, as well as antihistamines or decongestants.  REBOUND HEADACHES Q: What is a rebound headache?  What causes it? How can I treat it? A: A pattern of taking acute headache medications too often can lead to a condition known as "rebound headache." A pattern of taking too much headache medication  includes taking it more than 2 days per week or in excessive amounts. That means more than the label or a caregiver advises. With rebound headaches, your medications not only stop relieving pain, they actually begin to cause headaches. Doctors treat rebound headache by tapering the medication that is being overused. Sometimes your caregiver will gradually substitute a different type of treatment or medication. Stopping may be a challenge. Regularly overusing a medication increases the potential for serious side effects. Consult a caregiver if you regularly use headache medications more than 2 days per week or more than the label advises. ADDITIONAL QUESTIONS AND ANSWERS Q: What is biofeedback? A: Biofeedback is a self-help treatment. Biofeedback uses special equipment to monitor your body's involuntary physical responses. Biofeedback monitors:  Breathing.  Pulse.  Heart rate.  Temperature.  Muscle tension.  Brain activity. Biofeedback helps you refine and perfect your relaxation exercises. You learn to control the physical responses that are related to stress. Once the technique has been mastered, you do not need the equipment any more. Q: Are headaches hereditary? A: Four out of five (80%) of people that suffer report a family history of migraine. Scientists are not sure if this is genetic or a family predisposition. Despite the uncertainty, a child has a 50% chance of having migraine if one parent suffers. The child has a 75% chance if both parents suffer.  Q: Can children get headaches? A: By the time they reach high school, most young people have experienced some type of headache. Many safe and effective approaches or medications can prevent a headache from occurring or stop it after it has begun.  Q: What type of doctor should I see to diagnose and treat my headache? A: Start with your primary caregiver. Discuss his or her experience and approach to headaches. Discuss methods of  classification, diagnosis, and treatment. Your caregiver may decide to recommend you to a headache specialist, depending upon your symptoms or other physical conditions. Having diabetes, allergies, etc., may require a more comprehensive and inclusive approach to your headache. The [employer] will provide, upon request, a list of [facility] physician members in your state. Document Released: 07/10/2003 Document Revised: 07/12/2011 Document Reviewed: 12/18/2007 [facility] Patient Information 2014 [place], [place].

## 2013-09-03 NOTE — ED Notes (Signed)
Went in to D/C pt. Stated tramadol Rx makes him sick. EDP notified Rx changed to Tylenol #3. Pt. Req/recieved RTW note.

## 2013-09-03 NOTE — ED Notes (Signed)
C/o migraine HA x 1 week-no relief with multiple OTC meds

## 2013-09-04 ENCOUNTER — Encounter (HOSPITAL_BASED_OUTPATIENT_CLINIC_OR_DEPARTMENT_OTHER): Payer: Self-pay | Admitting: Emergency Medicine

## 2013-09-04 ENCOUNTER — Emergency Department (HOSPITAL_BASED_OUTPATIENT_CLINIC_OR_DEPARTMENT_OTHER)
Admission: EM | Admit: 2013-09-04 | Discharge: 2013-09-04 | Disposition: A | Discharge status: Home / Self Care | Payer: Self-pay | Attending: Emergency Medicine | Admitting: Emergency Medicine

## 2013-09-04 DIAGNOSIS — Z791 Long term (current) use of non-steroidal anti-inflammatories (NSAID): Secondary | ICD-10-CM | POA: Insufficient documentation

## 2013-09-04 DIAGNOSIS — F329 Major depressive disorder, single episode, unspecified: Secondary | ICD-10-CM | POA: Insufficient documentation

## 2013-09-04 DIAGNOSIS — G43909 Migraine, unspecified, not intractable, without status migrainosus: Secondary | ICD-10-CM | POA: Insufficient documentation

## 2013-09-04 DIAGNOSIS — F3289 Other specified depressive episodes: Secondary | ICD-10-CM | POA: Insufficient documentation

## 2013-09-04 DIAGNOSIS — F17200 Nicotine dependence, unspecified, uncomplicated: Secondary | ICD-10-CM | POA: Insufficient documentation

## 2013-09-04 DIAGNOSIS — R51 Headache: Secondary | ICD-10-CM

## 2013-09-04 DIAGNOSIS — Z79899 Other long term (current) drug therapy: Secondary | ICD-10-CM | POA: Insufficient documentation

## 2013-09-04 DIAGNOSIS — R519 Headache, unspecified: Secondary | ICD-10-CM
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
## 2013-09-04 NOTE — ED Notes (Signed)
Pt states he doesn't want any more medication or testing he feels he needs today to rest and sleep and wants a note for work to return tomorrow

## 2013-09-04 NOTE — Discharge Instructions (Signed)
Decrease your caffeine intake.  I have provided you with the [facility]. If your headaches persist, call to schedule a followup appointment.   Headaches, Frequently Asked Questions MIGRAINE HEADACHES Q: What is migraine? What causes it? How can I treat it? A: Generally, migraine headaches begin as a dull ache. Then they develop into a constant, throbbing, and pulsating pain. You may experience pain at the temples. You may experience pain at the front or back of one or both sides of the head. The pain is usually accompanied by a combination of:  Nausea.  Vomiting.  Sensitivity to light and noise. Some people (about 15%) experience an aura (see below) before an attack. The cause of migraine is believed to be chemical reactions in the brain. Treatment for migraine may include over-the-counter or prescription medications. It may also include self-help techniques. These include relaxation training and biofeedback.  Q: What is an aura? A: About 15% of people with migraine get an "aura". This is a sign of neurological symptoms that occur before a migraine headache. You may see wavy or jagged lines, dots, or flashing lights. You might experience tunnel vision or blind spots in one or both eyes. The aura can include visual or auditory hallucinations (something imagined). It may include disruptions in smell (such as strange odors), taste or touch. Other symptoms include:  Numbness.  A "pins and needles" sensation.  Difficulty in recalling or speaking the correct word. These neurological events may last as long as 60 minutes. These symptoms will fade as the headache begins. Q: What is a trigger? A: Certain physical or environmental factors can lead to or "trigger" a migraine. These include:  Foods.  Hormonal changes.  Weather.  Stress. It is important to remember that triggers are different for everyone. To help prevent migraine attacks, you need to figure out  which triggers affect you. Keep a headache diary. This is a good way to track triggers. The diary will help you talk to your healthcare professional about your condition. Q: Does weather affect migraines? A: Bright sunshine, hot, humid conditions, and drastic changes in barometric pressure may lead to, or "trigger," a migraine attack in some people. But studies have shown that weather does not act as a trigger for everyone with migraines. Q: What is the link between migraine and hormones? A: Hormones start and regulate many of your body's functions. Hormones keep your body in balance within a constantly changing environment. The levels of hormones in your body are unbalanced at times. Examples are during menstruation, pregnancy, or menopause. That can lead to a migraine attack. In fact, about three quarters of all women with migraine report that their attacks are related to the menstrual cycle.  Q: Is there an increased risk of stroke for migraine sufferers? A: The likelihood of a migraine attack causing a stroke is very remote. That is not to say that migraine sufferers cannot have a stroke associated with their migraines. In persons under age 29, the most common associated factor for stroke is migraine headache. But over the course of a person's normal life span, the occurrence of migraine headache may actually be associated with a reduced risk of dying from cerebrovascular disease due to stroke.  Q: What are acute medications for migraine? A: Acute medications are used to treat the pain of the headache after it has started. Examples over-the-counter medications, NSAIDs, ergots, and triptans.  Q: What are the triptans? A: Triptans are the newest class of abortive medications.  They are specifically targeted to treat migraine. Triptans are vasoconstrictors. They moderate some chemical reactions in the brain. The triptans work on receptors in your brain. Triptans help to restore the balance of a  neurotransmitter called serotonin. Fluctuations in levels of serotonin are thought to be a main cause of migraine.  °Q: Are over-the-counter medications for migraine effective? °A: Over-the-counter, or "OTC," medications may be effective in relieving mild to moderate pain and associated symptoms of migraine. But you should see your caregiver before beginning any treatment regimen for migraine.  °Q: What are preventive medications for migraine? °A: Preventive medications for migraine are sometimes referred to as "prophylactic" treatments. They are used to reduce the frequency, severity, and length of migraine attacks. Examples of preventive medications include antiepileptic medications, antidepressants, beta-blockers, calcium channel blockers, and NSAIDs (nonsteroidal anti-inflammatory drugs). °Q: Why are anticonvulsants used to treat migraine? °A: During the past few years, there has been an increased interest in antiepileptic drugs for the prevention of migraine. They are sometimes referred to as "anticonvulsants". Both epilepsy and migraine may be caused by similar reactions in the brain.  °Q: Why are antidepressants used to treat migraine? °A: Antidepressants are typically used to treat people with depression. They may reduce migraine frequency by regulating chemical levels, such as serotonin, in the brain.  °Q: What alternative therapies are used to treat migraine? °A: The term "alternative therapies" is often used to describe treatments considered outside the scope of conventional Western medicine. Examples of alternative therapy include acupuncture, acupressure, and yoga. Another common alternative treatment is herbal therapy. Some herbs are believed to relieve headache pain. Always discuss alternative therapies with your caregiver before proceeding. Some herbal products contain arsenic and other toxins. °TENSION HEADACHES °Q: What is a tension-type headache? What causes it? How can I treat it? °A:  Tension-type headaches occur randomly. They are often the result of temporary stress, anxiety, fatigue, or anger. Symptoms include soreness in your temples, a tightening band-like sensation around your head (a "vice-like" ache). Symptoms can also include a pulling feeling, pressure sensations, and contracting head and neck muscles. The headache begins in your forehead, temples, or the back of your head and neck. Treatment for tension-type headache may include over-the-counter or prescription medications. Treatment may also include self-help techniques such as relaxation training and biofeedback. °CLUSTER HEADACHES °Q: What is a cluster headache? What causes it? How can I treat it? °A: Cluster headache gets its name because the attacks come in groups. The pain arrives with little, if any, warning. It is usually on one side of the head. A tearing or bloodshot eye and a runny nose on the same side of the headache may also accompany the pain. Cluster headaches are believed to be caused by chemical reactions in the brain. They have been described as the most severe and intense of any headache type. Treatment for cluster headache includes prescription medication and oxygen. °SINUS HEADACHES °Q: What is a sinus headache? What causes it? How can I treat it? °A: When a cavity in the bones of the face and skull (a sinus) becomes inflamed, the inflammation will cause localized pain. This condition is usually the result of an allergic reaction, a tumor, or an infection. If your headache is caused by a sinus blockage, such as an infection, you will probably have a fever. An x-ray will confirm a sinus blockage. Your caregiver's treatment might include antibiotics for the infection, as well as antihistamines or decongestants.  °REBOUND HEADACHES °Q: What is a rebound   headache? What causes it? How can I treat it? A: A pattern of taking acute headache medications too often can lead to a condition known as "rebound headache." A  pattern of taking too much headache medication includes taking it more than 2 days per week or in excessive amounts. That means more than the label or a caregiver advises. With rebound headaches, your medications not only stop relieving pain, they actually begin to cause headaches. Doctors treat rebound headache by tapering the medication that is being overused. Sometimes your caregiver will gradually substitute a different type of treatment or medication. Stopping may be a challenge. Regularly overusing a medication increases the potential for serious side effects. Consult a caregiver if you regularly use headache medications more than 2 days per week or more than the label advises. ADDITIONAL QUESTIONS AND ANSWERS Q: What is biofeedback? A: Biofeedback is a self-help treatment. Biofeedback uses special equipment to monitor your body's involuntary physical responses. Biofeedback monitors:  Breathing.  Pulse.  Heart rate.  Temperature.  Muscle tension.  Brain activity. Biofeedback helps you refine and perfect your relaxation exercises. You learn to control the physical responses that are related to stress. Once the technique has been mastered, you do not need the equipment any more. Q: Are headaches hereditary? A: Four out of five (80%) of people that suffer report a family history of migraine. Scientists are not sure if this is genetic or a family predisposition. Despite the uncertainty, a child has a 50% chance of having migraine if one parent suffers. The child has a 75% chance if both parents suffer.  Q: Can children get headaches? A: By the time they reach high school, most young people have experienced some type of headache. Many safe and effective approaches or medications can prevent a headache from occurring or stop it after it has begun.  Q: What type of doctor should I see to diagnose and treat my headache? A: Start with your primary caregiver. Discuss his or her experience and  approach to headaches. Discuss methods of classification, diagnosis, and treatment. Your caregiver may decide to recommend you to a headache specialist, depending upon your symptoms or other physical conditions. Having diabetes, allergies, etc., may require a more comprehensive and inclusive approach to your headache. The National [employer] Foundation will provide, upon request, a list of [facility] physician members in your state. Document Released: 07/10/2003 Document Revised: 07/12/2011 Document Reviewed: 12/18/2007 [facility] Patient Information 2014 [place], [place].

## 2013-12-21 ENCOUNTER — Emergency Department (HOSPITAL_COMMUNITY)
Admission: EM | Admit: 2013-12-21 | Discharge: 2013-12-21 | Disposition: A | Discharge status: Home / Self Care | Payer: Worker's Compensation | Attending: Emergency Medicine | Admitting: Emergency Medicine

## 2013-12-21 ENCOUNTER — Emergency Department (HOSPITAL_COMMUNITY): Payer: Worker's Compensation

## 2013-12-21 ENCOUNTER — Encounter (HOSPITAL_COMMUNITY): Payer: Self-pay | Admitting: Emergency Medicine

## 2013-12-21 DIAGNOSIS — T07XXXA Unspecified multiple injuries, initial encounter: Secondary | ICD-10-CM | POA: Diagnosis not present

## 2013-12-21 DIAGNOSIS — Z791 Long term (current) use of non-steroidal anti-inflammatories (NSAID): Secondary | ICD-10-CM | POA: Diagnosis not present

## 2013-12-21 DIAGNOSIS — Z8679 Personal history of other diseases of the circulatory system: Secondary | ICD-10-CM | POA: Diagnosis not present

## 2013-12-21 DIAGNOSIS — F17200 Nicotine dependence, unspecified, uncomplicated: Secondary | ICD-10-CM | POA: Diagnosis not present

## 2013-12-21 DIAGNOSIS — Y9389 Activity, other specified: Secondary | ICD-10-CM | POA: Diagnosis not present

## 2013-12-21 DIAGNOSIS — S0990XA Unspecified injury of head, initial encounter: Secondary | ICD-10-CM | POA: Diagnosis present

## 2013-12-21 DIAGNOSIS — Z8659 Personal history of other mental and behavioral disorders: Secondary | ICD-10-CM | POA: Insufficient documentation

## 2013-12-21 DIAGNOSIS — Y92410 Unspecified street and highway as the place of occurrence of the external cause: Secondary | ICD-10-CM | POA: Diagnosis not present

## 2013-12-21 DIAGNOSIS — IMO0002 Reserved for concepts with insufficient information to code with codable children: Secondary | ICD-10-CM | POA: Diagnosis not present

## 2013-12-21 DIAGNOSIS — Z79899 Other long term (current) drug therapy: Secondary | ICD-10-CM | POA: Diagnosis not present

## 2013-12-21 LAB — CBC WITH DIFFERENTIAL/PLATELET
Basophils Absolute: 0 10*3/uL (ref 0.0–0.1)
Basophils Relative: 0 % (ref 0–1)
EOS ABS: 0.2 10*3/uL (ref 0.0–0.7)
Eosinophils Relative: 2 % (ref 0–5)
HEMATOCRIT: 45.5 % (ref 39.0–52.0)
HEMOGLOBIN: 15.2 g/dL (ref 13.0–17.0)
LYMPHS ABS: 2.4 10*3/uL (ref 0.7–4.0)
Lymphocytes Relative: 30 % (ref 12–46)
MCH: 30.7 pg (ref 26.0–34.0)
MCHC: 33.4 g/dL (ref 30.0–36.0)
MCV: 91.9 fL (ref 78.0–100.0)
MONOS PCT: 6 % (ref 3–12)
Monocytes Absolute: 0.5 10*3/uL (ref 0.1–1.0)
Neutro Abs: 4.8 10*3/uL (ref 1.7–7.7)
Neutrophils Relative %: 62 % (ref 43–77)
Platelets: 271 10*3/uL (ref 150–400)
RBC: 4.95 MIL/uL (ref 4.22–5.81)
RDW: 12.5 % (ref 11.5–15.5)
WBC: 7.9 10*3/uL (ref 4.0–10.5)

## 2013-12-21 LAB — COMPREHENSIVE METABOLIC PANEL
ALK PHOS: 54 U/L (ref 39–117)
ALT: 11 U/L (ref 0–53)
ANION GAP: 13 (ref 5–15)
AST: 22 U/L (ref 0–37)
Albumin: 4 g/dL (ref 3.5–5.2)
BILIRUBIN TOTAL: 1.1 mg/dL (ref 0.3–1.2)
BUN: 12 mg/dL (ref 6–23)
CHLORIDE: 105 meq/L (ref 96–112)
CO2: 23 mEq/L (ref 19–32)
Calcium: 9.3 mg/dL (ref 8.4–10.5)
Creatinine, Ser: 0.74 mg/dL (ref 0.50–1.35)
GFR calc non Af Amer: >90 mL/min (ref >90)
Glucose, Bld: 91 mg/dL (ref 70–99)
Potassium: 3.9 mEq/L (ref 3.7–5.3)
Sodium: 141 mEq/L (ref 137–147)
Total Protein: 7 g/dL (ref 6.0–8.3)

## 2013-12-21 LAB — I-STAT CHEM 8, ED
BUN: 12 mg/dL (ref 6–23)
CALCIUM ION: 1.14 mmol/L (ref 1.12–1.23)
CREATININE: 0.7 mg/dL (ref 0.50–1.35)
Chloride: 107 mEq/L (ref 96–112)
GLUCOSE: 96 mg/dL (ref 70–99)
HCT: 47 % (ref 39.0–52.0)
Hemoglobin: 16 g/dL (ref 13.0–17.0)
Potassium: 3.7 mEq/L (ref 3.7–5.3)
Sodium: 141 mEq/L (ref 137–147)
TCO2: 23 mmol/L (ref 0–100)

## 2013-12-21 LAB — PROTIME-INR
INR: 1 (ref 0.00–1.49)
Prothrombin Time: 13.2 seconds (ref 11.6–15.2)

## 2013-12-21 LAB — URINALYSIS, ROUTINE W REFLEX MICROSCOPIC
BILIRUBIN URINE: NEGATIVE
Glucose, UA: NEGATIVE mg/dL
Hgb urine dipstick: NEGATIVE
KETONES UR: NEGATIVE mg/dL
Leukocytes, UA: NEGATIVE
Nitrite: NEGATIVE
PH: 7.5 (ref 5.0–8.0)
PROTEIN: NEGATIVE mg/dL
Specific Gravity, Urine: 1.014 (ref 1.005–1.030)
UROBILINOGEN UA: 0.2 mg/dL (ref 0.0–1.0)

## 2013-12-21 LAB — SAMPLE TO BLOOD BANK

## 2013-12-21 MED ORDER — IOHEXOL 300 MG/ML  SOLN
100.0000 mL | Freq: Once | INTRAMUSCULAR | Status: AC | PRN
  Administered 2013-12-21: 100 mL via INTRAVENOUS

## 2013-12-21 MED ORDER — ONDANSETRON HCL 4 MG/2ML IJ SOLN
4.0000 mg | Freq: Once | INTRAMUSCULAR | Status: AC
  Administered 2013-12-21: 4 mg via INTRAVENOUS
  Filled 2013-12-21: qty 2

## 2013-12-21 MED ORDER — SODIUM CHLORIDE 0.9 % IV BOLUS (SEPSIS)
125.0000 mL | Freq: Once | INTRAVENOUS | Status: AC
  Administered 2013-12-21: 125 mL via INTRAVENOUS

## 2013-12-21 MED ORDER — OXYCODONE-ACETAMINOPHEN 5-325 MG PO TABS
2.0000 | ORAL_TABLET | Freq: Once | ORAL | Status: AC
  Administered 2013-12-21: 2 via ORAL
  Filled 2013-12-21: qty 2

## 2013-12-21 MED ORDER — IBUPROFEN 800 MG PO TABS: 800.0000 mg | ORAL_TABLET | Freq: Three times a day (TID) | ORAL | Status: DC | PRN

## 2013-12-21 MED ORDER — OXYCODONE-ACETAMINOPHEN 5-325 MG PO TABS: 1.0000 | ORAL_TABLET | Freq: Four times a day (QID) | ORAL | Status: DC | PRN

## 2013-12-21 MED ORDER — IBUPROFEN 800 MG PO TABS
800.0000 mg | ORAL_TABLET | Freq: Once | ORAL | Status: AC
  Administered 2013-12-21: 800 mg via ORAL
  Filled 2013-12-21: qty 1

## 2013-12-21 MED ORDER — HYDROMORPHONE HCL PF 1 MG/ML IJ SOLN
1.0000 mg | Freq: Once | INTRAMUSCULAR | Status: AC
  Administered 2013-12-21: 1 mg via INTRAVENOUS
  Filled 2013-12-21: qty 1
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
## 2013-12-21 NOTE — ED Notes (Signed)
xrays being completed at this time. Phlebotomy at the bedside.

## 2013-12-21 NOTE — ED Notes (Signed)
[employer] at the bedside 

## 2013-12-21 NOTE — ED Notes (Signed)
Pt returned from CT °
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
## 2013-12-21 NOTE — Discharge Instructions (Signed)
Contusion °A contusion is a deep bruise. Contusions are the result of an injury that caused bleeding under the skin. The contusion may turn blue, purple, or yellow. Minor injuries will give you a painless contusion, but more severe contusions may stay painful and swollen for a few weeks.  °CAUSES  °A contusion is usually caused by a blow, trauma, or direct force to an area of the body. °SYMPTOMS  °· Swelling and redness of the injured area. °· Bruising of the injured area. °· Tenderness and soreness of the injured area. °· Pain. °DIAGNOSIS  °The diagnosis can be made by taking a history and physical exam. An X-ray, CT scan, or MRI may be needed to determine if there were any associated injuries, such as fractures. °TREATMENT  °Specific treatment will depend on what area of the body was injured. In general, the best treatment for a contusion is resting, icing, elevating, and applying cold compresses to the injured area. Over-the-counter medicines may also be recommended for pain control. Ask your caregiver what the best treatment is for your contusion. °HOME CARE INSTRUCTIONS  °· Put ice on the injured area. °· Put ice in a plastic bag. °· Place a towel between your skin and the bag. °· Leave the ice on for 15-20 minutes, 3-4 times a day, or as directed by your health care provider. °· Only take over-the-counter or prescription medicines for pain, discomfort, or fever as directed by your caregiver. Your caregiver may recommend avoiding anti-inflammatory medicines (aspirin, ibuprofen, and naproxen) for 48 hours because these medicines may increase bruising. °· Rest the injured area. °· If possible, elevate the injured area to reduce swelling. °SEEK IMMEDIATE MEDICAL CARE IF:  °· You have increased bruising or swelling. °· You have pain that is getting worse. °· Your swelling or pain is not relieved with medicines. °MAKE SURE YOU:  °· Understand these instructions. °· Will watch your condition. °· Will get help right  away if you are not doing well or get worse. °Document Released: 01/27/2005 Document Revised: 04/24/2013 Document Reviewed: 02/22/2011 °ExitCare® Patient Information ©2015 [employer]. This information is not intended to replace advice given to you by your health care provider. Make sure you discuss any questions you have with your health care provider. °RICE: Routine Care for Injuries °The routine care of many injuries includes Rest, Ice, Compression, and Elevation (RICE). °HOME CARE INSTRUCTIONS °· Rest is needed to allow your body to heal. Routine activities can usually be resumed when comfortable. Injured tendons and bones can take up to 6 weeks to heal. Tendons are the cord-like structures that attach muscle to bone. °· Ice following an injury helps keep the swelling down and reduces pain. °¨ Put ice in a plastic bag. °¨ Place a towel between your skin and the bag. °¨ Leave the ice on for 15-20 minutes, 3-4 times a day, or as directed by your health care provider. Do this while awake, for the first 24 to 48 hours. After that, continue as directed by your caregiver. °· Compression helps keep swelling down. It also gives support and helps with discomfort. If an elastic bandage has been applied, it should be removed and reapplied every 3 to 4 hours. It should not be applied tightly, but firmly enough to keep swelling down. Watch fingers or toes for swelling, bluish discoloration, coldness, numbness, or excessive pain. If any of these problems occur, remove the bandage and reapply loosely. Contact your caregiver if these problems continue. °· Elevation helps reduce swelling   and decreases pain. With extremities, such as the arms, hands, legs, and feet, the injured area should be placed near or above the level of the heart, if possible. °SEEK IMMEDIATE MEDICAL CARE IF: °· You have persistent pain and swelling. °· You develop redness, numbness, or unexpected weakness. °· Your symptoms are getting worse rather than  improving after several days. °These symptoms may indicate that further evaluation or further X-rays are needed. Sometimes, X-rays may not show a small broken bone (fracture) until 1 week or 10 days later. Make a follow-up appointment with your caregiver. Ask when your X-ray results will be ready. Make sure you get your X-ray results. °Document Released: 08/01/2000 Document Revised: 04/24/2013 Document Reviewed: 09/18/2010 °ExitCare® Patient Information ©2015 [employer]. This information is not intended to replace advice given to you by your health care provider. Make sure you discuss any questions you have with your health care provider. ° °

## 2013-12-21 NOTE — ED Notes (Signed)
Pt ambulated without difficulty. Just states he is stiff feeling and ace wrap applied to right knee per pt request

## 2013-12-25 ENCOUNTER — Ambulatory Visit (INDEPENDENT_AMBULATORY_CARE_PROVIDER_SITE_OTHER): Payer: Self-pay | Admitting: Nurse Practitioner

## 2013-12-25 ENCOUNTER — Encounter: Payer: Self-pay | Admitting: Nurse Practitioner

## 2013-12-25 VITALS — BP 136/86 | HR 91 | Temp 98.1°F | Resp 18 | Ht 72.5 in | Wt 175.0 lb

## 2013-12-25 DIAGNOSIS — Z041 Encounter for examination and observation following transport accident: Secondary | ICD-10-CM

## 2013-12-25 DIAGNOSIS — K625 Hemorrhage of anus and rectum: Secondary | ICD-10-CM | POA: Insufficient documentation

## 2013-12-25 DIAGNOSIS — T1490XA Injury, unspecified, initial encounter: Secondary | ICD-10-CM

## 2013-12-25 DIAGNOSIS — Z043 Encounter for examination and observation following other accident: Secondary | ICD-10-CM

## 2013-12-25 MED ORDER — METHOCARBAMOL 750 MG PO TABS: 750.0000 mg | ORAL_TABLET | Freq: Two times a day (BID) | ORAL | Status: DC | PRN
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
## 2013-12-25 NOTE — Progress Notes (Signed)
Pre visit review using our clinic review tool, if applicable. No additional management support is needed unless otherwise documented below in the visit note. 

## 2013-12-31 ENCOUNTER — Encounter: Payer: Self-pay | Admitting: Nurse Practitioner

## 2013-12-31 ENCOUNTER — Ambulatory Visit (INDEPENDENT_AMBULATORY_CARE_PROVIDER_SITE_OTHER): Payer: Self-pay | Admitting: Nurse Practitioner

## 2013-12-31 ENCOUNTER — Ambulatory Visit (HOSPITAL_BASED_OUTPATIENT_CLINIC_OR_DEPARTMENT_OTHER)
Admission: RE | Admit: 2013-12-31 | Discharge: 2013-12-31 | Disposition: A | Discharge status: Home / Self Care | Payer: Self-pay | Source: Ambulatory Visit | Attending: Nurse Practitioner | Admitting: Nurse Practitioner

## 2013-12-31 VITALS — BP 153/89 | HR 55 | Temp 98.7°F | Resp 18 | Ht 72.5 in | Wt 173.0 lb

## 2013-12-31 DIAGNOSIS — M79609 Pain in unspecified limb: Secondary | ICD-10-CM

## 2013-12-31 DIAGNOSIS — M79642 Pain in left hand: Secondary | ICD-10-CM

## 2013-12-31 DIAGNOSIS — T1490XA Injury, unspecified, initial encounter: Secondary | ICD-10-CM

## 2013-12-31 DIAGNOSIS — Z041 Encounter for examination and observation following transport accident: Secondary | ICD-10-CM

## 2013-12-31 DIAGNOSIS — R10811 Right upper quadrant abdominal tenderness: Secondary | ICD-10-CM

## 2013-12-31 DIAGNOSIS — Z043 Encounter for examination and observation following other accident: Secondary | ICD-10-CM

## 2013-12-31 DIAGNOSIS — R10812 Left upper quadrant abdominal tenderness: Secondary | ICD-10-CM | POA: Insufficient documentation

## 2013-12-31 DIAGNOSIS — K625 Hemorrhage of anus and rectum: Secondary | ICD-10-CM

## 2013-12-31 LAB — CBC
HEMATOCRIT: 47.3 % (ref 39.0–52.0)
Hemoglobin: 15.6 g/dL (ref 13.0–17.0)
MCHC: 33.1 g/dL (ref 30.0–36.0)
MCV: 91.9 fl (ref 78.0–100.0)
Platelets: 295 10*3/uL (ref 150.0–400.0)
RBC: 5.14 Mil/uL (ref 4.22–5.81)
RDW: 12.9 % (ref 11.5–15.5)
WBC: 9 10*3/uL (ref 4.0–10.5)

## 2013-12-31 NOTE — Progress Notes (Signed)
Pre visit review using our clinic review tool, if applicable. No additional management support is needed unless otherwise documented below in the visit note. 
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
## 2013-12-31 NOTE — Patient Instructions (Signed)
Wear splint for comfort. Get xrays. Use tylenol for pain if needed.   We will call with xray & lab results.  Continue with gentle stretches to back.  Low Back Sprain with Rehab  A sprain is an injury in which a ligament is torn. The ligaments of the lower back are vulnerable to sprains. However, they are strong and require great force to be injured. These ligaments are important for stabilizing the spinal column. Sprains are classified into three categories. Grade 1 sprains cause pain, but the tendon is not lengthened. Grade 2 sprains include a lengthened ligament, due to the ligament being stretched or partially ruptured. With grade 2 sprains there is still function, although the function may be decreased. Grade 3 sprains involve a complete tear of the tendon or muscle, and function is usually impaired. SYMPTOMS   Severe pain in the lower back.  Sometimes, a feeling of a "pop," "snap," or tear, at the time of injury.  Tenderness and sometimes swelling at the injury site.  Uncommonly, bruising (contusion) within 48 hours of injury.  Muscle spasms in the back. CAUSES  Low back sprains occur when a force is placed on the ligaments that is greater than they can handle. Common causes of injury include:  Performing a stressful act while off-balance.  Repetitive stressful activities that involve movement of the lower back.  Direct hit (trauma) to the lower back. RISK INCREASES WITH:  Contact sports (football, wrestling).  Collisions (major skiing accidents).  Sports that require throwing or lifting (baseball, weightlifting).  Sports involving twisting of the spine (gymnastics, diving, tennis, golf).  Poor strength and flexibility.  Inadequate protection.  Previous back injury or surgery (especially fusion). PREVENTION  Wear properly fitted and padded protective equipment.  Warm up and stretch properly before activity.  Allow for adequate recovery between  workouts.  Maintain physical fitness:  Strength, flexibility, and endurance.  Cardiovascular fitness.  Maintain a healthy body weight. PROGNOSIS  If treated properly, low back sprains usually heal with non-surgical treatment. The length of time for healing depends on the severity of the injury.  RELATED COMPLICATIONS   Recurring symptoms, resulting in a chronic problem.  Chronic inflammation and pain in the low back.  Delayed healing or resolution of symptoms, especially if activity is resumed too soon.  Prolonged impairment.  Unstable or arthritic joints of the low back. TREATMENT  Treatment first involves the use of ice and medicine, to reduce pain and inflammation. The use of strengthening and stretching exercises may help reduce pain with activity. These exercises may be performed at home or with a therapist. Severe injuries may require referral to a therapist for further evaluation and treatment, such as ultrasound. Your caregiver may advise that you wear a back brace or corset, to help reduce pain and discomfort. Often, prolonged bed rest results in greater harm then benefit. Corticosteroid injections may be recommended. However, these should be reserved for the most serious cases. It is important to avoid using your back when lifting objects. At night, sleep on your back on a firm mattress, with a pillow placed under your knees. If non-surgical treatment is unsuccessful, surgery may be needed.  MEDICATION   If pain medicine is needed, nonsteroidal anti-inflammatory medicines (aspirin and ibuprofen), or other minor pain relievers (acetaminophen), are often advised.  Do not take pain medicine for 7 days before surgery.  Prescription pain relievers may be given, if your caregiver thinks they are needed. Use only as directed and only as much as  you need.  Ointments applied to the skin may be helpful.  Corticosteroid injections may be given by your caregiver. These injections  should be reserved for the most serious cases, because they may only be given a certain number of times. HEAT AND COLD  Cold treatment (icing) should be applied for 10 to 15 minutes every 2 to 3 hours for inflammation and pain, and immediately after activity that aggravates your symptoms. Use ice packs or an ice massage.  Heat treatment may be used before performing stretching and strengthening activities prescribed by your caregiver, physical therapist, or athletic trainer. Use a heat pack or a warm water soak. SEEK MEDICAL CARE IF:   Symptoms get worse or do not improve in 2 to 4 weeks, despite treatment.  You develop numbness or weakness in either leg.  You lose bowel or bladder function.  Any of the following occur after surgery: fever, increased pain, swelling, redness, drainage of fluids, or bleeding in the affected area.  New, unexplained symptoms develop. (Drugs used in treatment may produce side effects.) EXERCISES  RANGE OF MOTION (ROM) AND STRETCHING EXERCISES - Low Back Sprain Most people with lower back pain will find that their symptoms get worse with excessive bending forward (flexion) or arching at the lower back (extension). The exercises that will help resolve your symptoms will focus on the opposite motion.  Your physician, physical therapist or athletic trainer will help you determine which exercises will be most helpful to resolve your lower back pain. Do not complete any exercises without first consulting with your caregiver. Discontinue any exercises which make your symptoms worse, until you speak to your caregiver. If you have pain, numbness or tingling which travels down into your buttocks, leg or foot, the goal of the therapy is for these symptoms to move closer to your back and eventually resolve. Sometimes, these leg symptoms will get better, but your lower back pain may worsen. This is often an indication of progress in your rehabilitation. Be very alert to any  changes in your symptoms and the activities in which you participated in the 24 hours prior to the change. Sharing this information with your caregiver will allow him or her to most efficiently treat your condition. These exercises may help you when beginning to rehabilitate your injury. Your symptoms may resolve with or without further involvement from your physician, physical therapist or athletic trainer. While completing these exercises, remember:   Restoring tissue flexibility helps normal motion to return to the joints. This allows healthier, less painful movement and activity.  An effective stretch should be held for at least 30 seconds.  A stretch should never be painful. You should only feel a gentle lengthening or release in the stretched tissue. FLEXION RANGE OF MOTION AND STRETCHING EXERCISES: STRETCH - Flexion, Single Knee to Chest   Lie on a firm bed or floor with both legs extended in front of you.  Keeping one leg in contact with the floor, bring your opposite knee to your chest. Hold your leg in place by either grabbing behind your thigh or at your knee.  Pull until you feel a gentle stretch in your low back. Hold __________ seconds.  Slowly release your grasp and repeat the exercise with the opposite side. Repeat __________ times. Complete this exercise __________ times per day.  STRETCH - Flexion, Double Knee to Chest  Lie on a firm bed or floor with both legs extended in front of you.  Keeping one leg in contact with  the floor, bring your opposite knee to your chest.  Tense your stomach muscles to support your back and then lift your other knee to your chest. Hold your legs in place by either grabbing behind your thighs or at your knees.  Pull both knees toward your chest until you feel a gentle stretch in your low back. Hold __________ seconds.  Tense your stomach muscles and slowly return one leg at a time to the floor. Repeat __________ times. Complete this  exercise __________ times per day.  STRETCH - Low Trunk Rotation  Lie on a firm bed or floor. Keeping your legs in front of you, bend your knees so they are both pointed toward the ceiling and your feet are flat on the floor.  Extend your arms out to the side. This will stabilize your upper body by keeping your shoulders in contact with the floor.  Gently and slowly drop both knees together to one side until you feel a gentle stretch in your low back. Hold for __________ seconds.  Tense your stomach muscles to support your lower back as you bring your knees back to the starting position. Repeat the exercise to the other side. Repeat __________ times. Complete this exercise __________ times per day  EXTENSION RANGE OF MOTION AND FLEXIBILITY EXERCISES: STRENGTHENING - Deep Abdominals, Pelvic Tilt   Lie on a firm bed or floor. Keeping your legs in front of you, bend your knees so they are both pointed toward the ceiling and your feet are flat on the floor.  Tense your lower abdominal muscles to press your low back into the floor. This motion will rotate your pelvis so that your tail bone is scooping upwards rather than pointing at your feet or into the floor. With a gentle tension and even breathing, hold this position for __________ seconds. Repeat __________ times. Complete this exercise __________ times per day.  STRENGTHENING - Abdominals, Crunches   Lie on a firm bed or floor. Keeping your legs in front of you, bend your knees so they are both pointed toward the ceiling and your feet are flat on the floor. Cross your arms over your chest.  Slightly tip your chin down without bending your neck.  Tense your abdominals and slowly lift your trunk high enough to just clear your shoulder blades. Lifting higher can put excessive stress on the lower back and does not further strengthen your abdominal muscles.  Control your return to the starting position. Repeat __________ times. Complete this  exercise __________ times per day.  STRENGTHENING - Quadruped, Opposite UE/LE Lift   Assume a hands and knees position on a firm surface. Keep your hands under your shoulders and your knees under your hips. You may place padding under your knees for comfort.  Find your neutral spine and gently tense your abdominal muscles so that you can maintain this position. Your shoulders and hips should form a rectangle that is parallel with the floor and is not twisted.  Keeping your trunk steady, lift your right hand no higher than your shoulder and then your left leg no higher than your hip. Make sure you are not holding your breath. Hold this position for __________ seconds.  Continuing to keep your abdominal muscles tense and your back steady, slowly return to your starting position. Repeat with the opposite arm and leg. Repeat __________ times. Complete this exercise __________ times per day.  STRENGTHENING - Abdominals and Quadriceps, Straight Leg Raise   Lie on a firm bed or  floor with both legs extended in front of you.  Keeping one leg in contact with the floor, bend the other knee so that your foot can rest flat on the floor.  Find your neutral spine, and tense your abdominal muscles to maintain your spinal position throughout the exercise.  Slowly lift your straight leg off the floor about 6 inches for a count of 15, making sure to not hold your breath.  Still keeping your neutral spine, slowly lower your leg all the way to the floor. Repeat this exercise with each leg __________ times. Complete this exercise __________ times per day. POSTURE AND BODY MECHANICS CONSIDERATIONS - Low Back Sprain Keeping correct posture when sitting, standing or completing your activities will reduce the stress put on different body tissues, allowing injured tissues a chance to heal and limiting painful experiences. The following are general guidelines for improved posture. Your physician or physical therapist  will provide you with any instructions specific to your needs. While reading these guidelines, remember:  The exercises prescribed by your provider will help you have the flexibility and strength to maintain correct postures.  The correct posture provides the best environment for your joints to work. All of your joints have less wear and tear when properly supported by a spine with good posture. This means you will experience a healthier, less painful body.  Correct posture must be practiced with all of your activities, especially prolonged sitting and standing. Correct posture is as important when doing repetitive low-stress activities (typing) as it is when doing a single heavy-load activity (lifting). RESTING POSITIONS Consider which positions are most painful for you when choosing a resting position. If you have pain with flexion-based activities (sitting, bending, stooping, squatting), choose a position that allows you to rest in a less flexed posture. You would want to avoid curling into a fetal position on your side. If your pain worsens with extension-based activities (prolonged standing, working overhead), avoid resting in an extended position such as sleeping on your stomach. Most people will find more comfort when they rest with their spine in a more neutral position, neither too rounded nor too arched. Lying on a non-sagging bed on your side with a pillow between your knees, or on your back with a pillow under your knees will often provide some relief. Keep in mind, being in any one position for a prolonged period of time, no matter how correct your posture, can still lead to stiffness. PROPER SITTING POSTURE In order to minimize stress and discomfort on your spine, you must sit with correct posture. Sitting with good posture should be effortless for a healthy body. Returning to good posture is a gradual process. Many people can work toward this most comfortably by using various supports until  they have the flexibility and strength to maintain this posture on their own. When sitting with proper posture, your ears will fall over your shoulders and your shoulders will fall over your hips. You should use the back of the chair to support your upper back. Your lower back will be in a neutral position, just slightly arched. You may place a small pillow or folded towel at the base of your lower back for  support.  When working at a desk, create an environment that supports good, upright posture. Without extra support, muscles tire, which leads to excessive strain on joints and other tissues. Keep these recommendations in mind: CHAIR:  A chair should be able to slide under your desk when your back  makes contact with the back of the chair. This allows you to work closely.  The chair's height should allow your eyes to be level with the upper part of your monitor and your hands to be slightly lower than your elbows. BODY POSITION  Your feet should make contact with the floor. If this is not possible, use a foot rest.  Keep your ears over your shoulders. This will reduce stress on your neck and low back. INCORRECT SITTING POSTURES  If you are feeling tired and unable to assume a healthy sitting posture, do not slouch or slump. This puts excessive strain on your back tissues, causing more damage and pain. Healthier options include:  Using more support, like a lumbar pillow.  Switching tasks to something that requires you to be upright or walking.  Talking a brief walk.  Lying down to rest in a neutral-spine position. PROLONGED STANDING WHILE SLIGHTLY LEANING FORWARD  When completing a task that requires you to lean forward while standing in one place for a long time, place either foot up on a stationary 2-4 inch high object to help maintain the best posture. When both feet are on the ground, the lower back tends to lose its slight inward curve. If this curve flattens (or becomes too large),  then the back and your other joints will experience too much stress, tire more quickly, and can cause pain. CORRECT STANDING POSTURES Proper standing posture should be assumed with all daily activities, even if they only take a few moments, like when brushing your teeth. As in sitting, your ears should fall over your shoulders and your shoulders should fall over your hips. You should keep a slight tension in your abdominal muscles to brace your spine. Your tailbone should point down to the ground, not behind your body, resulting in an over-extended swayback posture.  INCORRECT STANDING POSTURES  Common incorrect standing postures include a forward head, locked knees and/or an excessive swayback. WALKING Walk with an upright posture. Your ears, shoulders and hips should all line-up. PROLONGED ACTIVITY IN A FLEXED POSITION When completing a task that requires you to bend forward at your waist or lean over a low surface, try to find a way to stabilize 3 out of 4 of your limbs. You can place a hand or elbow on your thigh or rest a knee on the surface you are reaching across. This will provide you more stability, so that your muscles do not tire as quickly. By keeping your knees relaxed, or slightly bent, you will also reduce stress across your lower back. CORRECT LIFTING TECHNIQUES DO :  Assume a wide stance. This will provide you more stability and the opportunity to get as close as possible to the object which you are lifting.  Tense your abdominals to brace your spine. Bend at the knees and hips. Keeping your back locked in a neutral-spine position, lift using your leg muscles. Lift with your legs, keeping your back straight.  Test the weight of unknown objects before attempting to lift them.  Try to keep your elbows locked down at your sides in order get the best strength from your shoulders when carrying an object.  Always ask for help when lifting heavy or awkward objects. INCORRECT LIFTING  TECHNIQUES DO NOT:   Lock your knees when lifting, even if it is a small object.  Bend and twist. Pivot at your feet or move your feet when needing to change directions.  Assume that you can safely pick up  even a paperclip without proper posture. Document Released: 04/19/2005 Document Revised: 07/12/2011 Document Reviewed: 08/01/2008 [facility] Patient Information 2015 [place], [place]. This information is not intended to replace advice given to you by your health care provider. Make sure you discuss any questions you have with your health care provider.
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
## 2013-12-31 NOTE — Assessment & Plan Note (Signed)
Muscle pain improving in L shoulder, R leg & knee. Still feels a little sore, but much better. Reports decreased appetitie LUQ tenderness on exam Tender at L ulnar stylus & proximal metacarpals. Xray wrist & hand. Splint applied.

## 2013-12-31 NOTE — Assessment & Plan Note (Signed)
Improvement in L shoulder & arm pain & R groin, leg & knee pain. Continue gentle stretches Tylenol as needed.
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
## 2014-01-01 ENCOUNTER — Telehealth: Payer: Self-pay | Admitting: Nurse Practitioner

## 2014-01-01 NOTE — Telephone Encounter (Signed)
pls call pt: Advise No fracture in wrist or hand. Pain & swelling is due to sprain. Continue to wear splint until pain & swelling resolve-probably 3 weeks.  I will write letter to return to work once I have labs back. Should be back this afternoon.

## 2014-01-01 NOTE — Telephone Encounter (Signed)
Spoke with pt, advised message from [place]. Pt understood. I told him once the letter is written I would give him a call.

## 2014-01-02 ENCOUNTER — Telehealth: Payer: Self-pay | Admitting: Nurse Practitioner

## 2014-01-02 LAB — COMPREHENSIVE METABOLIC PANEL
ALK PHOS: 55 U/L (ref 39–117)
ALT: 17 U/L (ref 0–53)
AST: 25 U/L (ref 0–37)
Albumin: 4.3 g/dL (ref 3.5–5.2)
BILIRUBIN TOTAL: 0.9 mg/dL (ref 0.2–1.2)
BUN: 9 mg/dL (ref 6–23)
CO2: 23 mEq/L (ref 19–32)
Calcium: 9.7 mg/dL (ref 8.4–10.5)
Chloride: 109 mEq/L (ref 96–112)
Creatinine, Ser: 0.8 mg/dL (ref 0.4–1.5)
GFR: 123.39 mL/min (ref >60.00)
Glucose, Bld: 87 mg/dL (ref 70–99)
Potassium: 4.2 mEq/L (ref 3.5–5.1)
SODIUM: 143 meq/L (ref 135–145)
TOTAL PROTEIN: 7.5 g/dL (ref 6.0–8.3)

## 2014-01-02 NOTE — Telephone Encounter (Signed)
pls call pt: Advise Blood work is normal.

## 2014-01-03 NOTE — Telephone Encounter (Signed)
Lmom all labs normal.

## 2014-01-04 ENCOUNTER — Other Ambulatory Visit: Payer: Self-pay | Admitting: Nurse Practitioner

## 2014-01-04 ENCOUNTER — Telehealth: Payer: Self-pay | Admitting: Nurse Practitioner

## 2014-01-04 ENCOUNTER — Telehealth: Payer: Self-pay

## 2014-01-04 DIAGNOSIS — S6992XS Unspecified injury of left wrist, hand and finger(s), sequela: Secondary | ICD-10-CM

## 2014-01-04 DIAGNOSIS — M25532 Pain in left wrist: Secondary | ICD-10-CM

## 2014-01-04 MED ORDER — NAPROXEN 500 MG PO TABS: 500.0000 mg | ORAL_TABLET | Freq: Every day | ORAL | Status: DC

## 2014-01-04 NOTE — Telephone Encounter (Signed)
-----   Message from Kelle Darting, NP sent at 01/04/2014 12:03 PM ----- pls let pt know I have referred him to ortho for further eval. Does he want note for modified duty? What can he do without using L hand?  I spoke with pt and he has agreed to ortho referral. He wants to know if he can have something stronger for pain? Please advise. There is no modified duty at his job.

## 2014-01-04 NOTE — Telephone Encounter (Signed)
LMOM Rx sent to the pharmacy

## 2014-01-04 NOTE — Telephone Encounter (Signed)
Pt called stating his hand is worse and in constant pain. He wants to know if there is anything else he can do because he cannot be out of work. He states he has done everything you have suggested but no relief. Please advise.

## 2014-01-04 NOTE — Telephone Encounter (Signed)
Pt wants pain med for wrist. Will order naprosyn. Ref to ortho.

## 2014-04-15 ENCOUNTER — Encounter (HOSPITAL_BASED_OUTPATIENT_CLINIC_OR_DEPARTMENT_OTHER): Payer: Self-pay | Admitting: *Deleted

## 2014-04-15 ENCOUNTER — Emergency Department (HOSPITAL_BASED_OUTPATIENT_CLINIC_OR_DEPARTMENT_OTHER): Payer: Self-pay

## 2014-04-15 ENCOUNTER — Emergency Department (HOSPITAL_BASED_OUTPATIENT_CLINIC_OR_DEPARTMENT_OTHER)
Admission: EM | Admit: 2014-04-15 | Discharge: 2014-04-15 | Disposition: A | Discharge status: Home / Self Care | Payer: Self-pay | Attending: Emergency Medicine | Admitting: Emergency Medicine

## 2014-04-15 DIAGNOSIS — Z8659 Personal history of other mental and behavioral disorders: Secondary | ICD-10-CM | POA: Insufficient documentation

## 2014-04-15 DIAGNOSIS — M25532 Pain in left wrist: Secondary | ICD-10-CM | POA: Insufficient documentation

## 2014-04-15 DIAGNOSIS — Z79899 Other long term (current) drug therapy: Secondary | ICD-10-CM | POA: Insufficient documentation

## 2014-04-15 DIAGNOSIS — Z8679 Personal history of other diseases of the circulatory system: Secondary | ICD-10-CM | POA: Insufficient documentation

## 2014-04-15 DIAGNOSIS — Z720 Tobacco use: Secondary | ICD-10-CM | POA: Insufficient documentation

## 2014-04-15 DIAGNOSIS — M25512 Pain in left shoulder: Secondary | ICD-10-CM | POA: Insufficient documentation

## 2014-04-15 DIAGNOSIS — Z791 Long term (current) use of non-steroidal anti-inflammatories (NSAID): Secondary | ICD-10-CM | POA: Insufficient documentation

## 2014-04-15 MED ORDER — HYDROCODONE-ACETAMINOPHEN 5-325 MG PO TABS: 1.0000 | ORAL_TABLET | Freq: Four times a day (QID) | ORAL | Status: DC | PRN

## 2014-04-15 MED ORDER — HYDROCODONE-ACETAMINOPHEN 5-325 MG PO TABS
2.0000 | ORAL_TABLET | Freq: Once | ORAL | Status: AC
  Administered 2014-04-15: 2 via ORAL
  Filled 2014-04-15: qty 2

## 2014-04-15 MED ORDER — NAPROXEN 500 MG PO TABS: 500.0000 mg | ORAL_TABLET | Freq: Two times a day (BID) | ORAL | Status: DC
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
## 2014-04-15 NOTE — Discharge Instructions (Signed)
Please follow up with your primary care physician in 1-2 days. If you do not have one please [facility] listed above. Please follow up with Dr. Pearletha ForgeHudnall to schedule a follow up appointment.  Please take pain medication and/or muscle relaxants as prescribed and as needed for pain. Please do not drive on narcotic pain medication or on muscle relaxants. Please follow RICE method below. Please read all discharge instructions and return precautions.   RICE: Routine Care for Injuries The routine care of many injuries includes Rest, Ice, Compression, and Elevation (RICE). HOME CARE INSTRUCTIONS  Rest is needed to allow your body to heal. Routine activities can usually be resumed when comfortable. Injured tendons and bones can take up to 6 weeks to heal. Tendons are the cord-like structures that attach muscle to bone.  Ice following an injury helps keep the swelling down and reduces pain.  Put ice in a plastic bag.  Place a towel between your skin and the bag.  Leave the ice on for 15-20 minutes, 3-4 times a day, or as directed by your health care provider. Do this while awake, for the first 24 to 48 hours. After that, continue as directed by your caregiver.  Compression helps keep swelling down. It also gives support and helps with discomfort. If an elastic bandage has been applied, it should be removed and reapplied every 3 to 4 hours. It should not be applied tightly, but firmly enough to keep swelling down. Watch fingers or toes for swelling, bluish discoloration, coldness, numbness, or excessive pain. If any of these problems occur, remove the bandage and reapply loosely. Contact your caregiver if these problems continue.  Elevation helps reduce swelling and decreases pain. With extremities, such as the arms, hands, legs, and feet, the injured area should be placed near or above the level of the heart, if possible. SEEK IMMEDIATE MEDICAL CARE IF:  You have  persistent pain and swelling.  You develop redness, numbness, or unexpected weakness.  Your symptoms are getting worse rather than improving after several days. These symptoms may indicate that further evaluation or further X-rays are needed. Sometimes, X-rays may not show a small broken bone (fracture) until 1 week or 10 days later. Make a follow-up appointment with your caregiver. Ask when your X-ray results will be ready. Make sure you get your X-ray results. Document Released: 08/01/2000 Document Revised: 04/24/2013 Document Reviewed: 09/18/2010 [facility] Patient Information 2015 [place], [place]. This information is not intended to replace advice given to you by your health care provider. Make sure you discuss any questions you have with your health care provider. Shoulder Pain The shoulder is the joint that connects your arms to your body. The bones that form the shoulder joint include the upper arm bone (humerus), the shoulder blade (scapula), and the collarbone (clavicle). The top of the humerus is shaped like a ball and fits into a rather flat socket on the scapula (glenoid cavity). A combination of muscles and strong, fibrous tissues that connect muscles to bones (tendons) support your shoulder joint and hold the ball in the socket. Small, fluid-filled sacs (bursae) are located in different areas of the joint. They act as cushions between the bones and the overlying soft tissues and help reduce friction between the gliding tendons and the bone as you move your arm. Your shoulder joint allows a wide range of motion in your arm. This range of motion allows you to do things like scratch your back or throw  a ball. However, this range of motion also makes your shoulder more prone to pain from overuse and injury. Causes of shoulder pain can originate from both injury and overuse and usually can be grouped in the following four categories:  Redness, swelling, and pain (inflammation) of the tendon  (tendinitis) or the bursae (bursitis).  Instability, such as a dislocation of the joint.  Inflammation of the joint (arthritis).  Broken bone (fracture). HOME CARE INSTRUCTIONS   Apply ice to the sore area.  Put ice in a plastic bag.  Place a towel between your skin and the bag.  Leave the ice on for 15-20 minutes, 3-4 times per day for the first 2 days, or as directed by your health care provider.  Stop using cold packs if they do not help with the pain.  If you have a shoulder sling or immobilizer, wear it as long as your caregiver instructs. Only remove it to shower or bathe. Move your arm as little as possible, but keep your hand moving to prevent swelling.  Squeeze a soft ball or foam pad as much as possible to help prevent swelling.  Only take over-the-counter or prescription medicines for pain, discomfort, or fever as directed by your caregiver. SEEK MEDICAL CARE IF:   Your shoulder pain increases, or new pain develops in your arm, hand, or fingers.  Your hand or fingers become cold and numb.  Your pain is not relieved with medicines. SEEK IMMEDIATE MEDICAL CARE IF:   Your arm, hand, or fingers are numb or tingling.  Your arm, hand, or fingers are significantly swollen or turn white or blue. MAKE SURE YOU:   Understand these instructions.  Will watch your condition.  Will get help right away if you are not doing well or get worse. Document Released: 01/27/2005 Document Revised: 09/03/2013 Document Reviewed: 04/03/2011 [facility] Center[facility]Care Patient Information 2015 [place], [place]. This information is not intended to replace advice given to you by your health care provider. Make sure you discuss any questions you have with your health care provider.

## 2014-04-15 NOTE — ED Notes (Signed)
Pt amb to room 5 with quick steady gait in nad. Pt reports being "tossed off" while sliding down sliding board with children on Saturday. Pt states he landed on both hands, and has had left shoulder pain since. Pt reports left wrist pain also. Denies any head injury or loc.

## 2014-06-05 ENCOUNTER — Encounter (HOSPITAL_BASED_OUTPATIENT_CLINIC_OR_DEPARTMENT_OTHER): Payer: Self-pay

## 2014-06-05 ENCOUNTER — Emergency Department (HOSPITAL_BASED_OUTPATIENT_CLINIC_OR_DEPARTMENT_OTHER)
Admission: EM | Admit: 2014-06-05 | Discharge: 2014-06-05 | Disposition: A | Discharge status: Home / Self Care | Payer: No Typology Code available for payment source | Attending: Emergency Medicine | Admitting: Emergency Medicine

## 2014-06-05 DIAGNOSIS — Z8659 Personal history of other mental and behavioral disorders: Secondary | ICD-10-CM | POA: Insufficient documentation

## 2014-06-05 DIAGNOSIS — Z791 Long term (current) use of non-steroidal anti-inflammatories (NSAID): Secondary | ICD-10-CM | POA: Insufficient documentation

## 2014-06-05 DIAGNOSIS — K029 Dental caries, unspecified: Secondary | ICD-10-CM | POA: Insufficient documentation

## 2014-06-05 DIAGNOSIS — K0381 Cracked tooth: Secondary | ICD-10-CM | POA: Insufficient documentation

## 2014-06-05 DIAGNOSIS — K0889 Other specified disorders of teeth and supporting structures: Secondary | ICD-10-CM

## 2014-06-05 DIAGNOSIS — G43909 Migraine, unspecified, not intractable, without status migrainosus: Secondary | ICD-10-CM | POA: Insufficient documentation

## 2014-06-05 DIAGNOSIS — Z7982 Long term (current) use of aspirin: Secondary | ICD-10-CM | POA: Insufficient documentation

## 2014-06-05 MED ORDER — HYDROCODONE-ACETAMINOPHEN 5-325 MG PO TABS: 1.0000 | ORAL_TABLET | Freq: Four times a day (QID) | ORAL | Status: DC | PRN

## 2014-06-05 MED ORDER — PENICILLIN V POTASSIUM 500 MG PO TABS: 500.0000 mg | ORAL_TABLET | Freq: Four times a day (QID) | ORAL | Status: AC

## 2014-06-05 NOTE — Discharge Instructions (Signed)

## 2014-06-05 NOTE — ED Notes (Signed)
Dental pai since November.  States he broke tooth while eating. Pain has increased the past 2 days and unrelieved after taking  Motrin, Goody Powder, salt water rinses, and acetaminophen. Requesting a dental referral.
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
## 2014-08-08 ENCOUNTER — Encounter (HOSPITAL_BASED_OUTPATIENT_CLINIC_OR_DEPARTMENT_OTHER): Payer: Self-pay | Admitting: *Deleted

## 2014-08-08 ENCOUNTER — Emergency Department (HOSPITAL_BASED_OUTPATIENT_CLINIC_OR_DEPARTMENT_OTHER)
Admission: EM | Admit: 2014-08-08 | Discharge: 2014-08-08 | Disposition: A | Discharge status: Home / Self Care | Payer: Self-pay | Attending: Emergency Medicine | Admitting: Emergency Medicine

## 2014-08-08 ENCOUNTER — Emergency Department (HOSPITAL_BASED_OUTPATIENT_CLINIC_OR_DEPARTMENT_OTHER): Payer: Self-pay

## 2014-08-08 DIAGNOSIS — Z720 Tobacco use: Secondary | ICD-10-CM | POA: Insufficient documentation

## 2014-08-08 DIAGNOSIS — Z8659 Personal history of other mental and behavioral disorders: Secondary | ICD-10-CM | POA: Insufficient documentation

## 2014-08-08 DIAGNOSIS — Z8679 Personal history of other diseases of the circulatory system: Secondary | ICD-10-CM | POA: Insufficient documentation

## 2014-08-08 DIAGNOSIS — S20221A Contusion of right back wall of thorax, initial encounter: Secondary | ICD-10-CM | POA: Insufficient documentation

## 2014-08-08 DIAGNOSIS — Y998 Other external cause status: Secondary | ICD-10-CM | POA: Insufficient documentation

## 2014-08-08 DIAGNOSIS — S5001XA Contusion of right elbow, initial encounter: Secondary | ICD-10-CM | POA: Insufficient documentation

## 2014-08-08 DIAGNOSIS — W1839XA Other fall on same level, initial encounter: Secondary | ICD-10-CM | POA: Insufficient documentation

## 2014-08-08 DIAGNOSIS — Y9289 Other specified places as the place of occurrence of the external cause: Secondary | ICD-10-CM | POA: Insufficient documentation

## 2014-08-08 DIAGNOSIS — Y9339 Activity, other involving climbing, rappelling and jumping off: Secondary | ICD-10-CM | POA: Insufficient documentation

## 2014-08-08 DIAGNOSIS — Z791 Long term (current) use of non-steroidal anti-inflammatories (NSAID): Secondary | ICD-10-CM | POA: Insufficient documentation

## 2014-08-08 LAB — URINALYSIS, ROUTINE W REFLEX MICROSCOPIC
Bilirubin Urine: NEGATIVE
Glucose, UA: NEGATIVE mg/dL
HGB URINE DIPSTICK: NEGATIVE
Ketones, ur: NEGATIVE mg/dL
Leukocytes, UA: NEGATIVE
NITRITE: NEGATIVE
PH: 6.5 (ref 5.0–8.0)
Protein, ur: NEGATIVE mg/dL
SPECIFIC GRAVITY, URINE: 1.007 (ref 1.005–1.030)
Urobilinogen, UA: 0.2 mg/dL (ref 0.0–1.0)

## 2014-08-08 MED ORDER — HYDROCODONE-ACETAMINOPHEN 5-325 MG PO TABS: 1.0000 | ORAL_TABLET | Freq: Four times a day (QID) | ORAL | Status: DC | PRN

## 2014-08-08 MED ORDER — OXYCODONE-ACETAMINOPHEN 5-325 MG PO TABS
2.0000 | ORAL_TABLET | Freq: Once | ORAL | Status: AC
  Administered 2014-08-08: 2 via ORAL
  Filled 2014-08-08: qty 2
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
## 2014-08-08 NOTE — Discharge Instructions (Signed)
Contusion °A contusion is a deep bruise. Contusions are the result of an injury that caused bleeding under the skin. The contusion may turn blue, purple, or yellow. Minor injuries will give you a painless contusion, but more severe contusions may stay painful and swollen for a few weeks.  °CAUSES  °A contusion is usually caused by a blow, trauma, or direct force to an area of the body. °SYMPTOMS  °· Swelling and redness of the injured area. °· Bruising of the injured area. °· Tenderness and soreness of the injured area. °· Pain. °DIAGNOSIS  °The diagnosis can be made by taking a history and physical exam. An X-ray, CT scan, or MRI may be needed to determine if there were any associated injuries, such as fractures. °TREATMENT  °Specific treatment will depend on what area of the body was injured. In general, the best treatment for a contusion is resting, icing, elevating, and applying cold compresses to the injured area. Over-the-counter medicines may also be recommended for pain control. Ask your caregiver what the best treatment is for your contusion. °HOME CARE INSTRUCTIONS  °· Put ice on the injured area. °¨ Put ice in a plastic bag. °¨ Place a towel between your skin and the bag. °¨ Leave the ice on for 15-20 minutes, 3-4 times a day, or as directed by your health care provider. °· Only take over-the-counter or prescription medicines for pain, discomfort, or fever as directed by your caregiver. Your caregiver may recommend avoiding anti-inflammatory medicines (aspirin, ibuprofen, and naproxen) for 48 hours because these medicines may increase bruising. °· Rest the injured area. °· If possible, elevate the injured area to reduce swelling. °SEEK IMMEDIATE MEDICAL CARE IF:  °· You have increased bruising or swelling. °· You have pain that is getting worse. °· Your swelling or pain is not relieved with medicines. °MAKE SURE YOU:  °· Understand these instructions. °· Will watch your condition. °· Will get help right  away if you are not doing well or get worse. °Document Released: 01/27/2005 Document Revised: 04/24/2013 Document Reviewed: 02/22/2011 °ExitCare® Patient Information ©2015 [employer], [employer]. This information is not intended to replace advice given to you by your health care provider. Make sure you discuss any questions you have with your health care provider. ° °

## 2014-08-08 NOTE — ED Notes (Signed)
Pt directed to pharmacy to pick up medications- pt has called friend for a ride in the presence of the RN

## 2014-08-08 NOTE — ED Notes (Signed)
PA at bedside.
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
## 2015-02-07 ENCOUNTER — Emergency Department (HOSPITAL_BASED_OUTPATIENT_CLINIC_OR_DEPARTMENT_OTHER)
Admission: EM | Admit: 2015-02-07 | Discharge: 2015-02-07 | Disposition: A | Discharge status: Home / Self Care | Payer: Self-pay | Attending: Emergency Medicine | Admitting: Emergency Medicine

## 2015-02-07 ENCOUNTER — Encounter (HOSPITAL_BASED_OUTPATIENT_CLINIC_OR_DEPARTMENT_OTHER): Payer: Self-pay | Admitting: *Deleted

## 2015-02-07 ENCOUNTER — Emergency Department (HOSPITAL_BASED_OUTPATIENT_CLINIC_OR_DEPARTMENT_OTHER): Payer: Self-pay

## 2015-02-07 DIAGNOSIS — Y9289 Other specified places as the place of occurrence of the external cause: Secondary | ICD-10-CM | POA: Insufficient documentation

## 2015-02-07 DIAGNOSIS — W28XXXA Contact with powered lawn mower, initial encounter: Secondary | ICD-10-CM | POA: Insufficient documentation

## 2015-02-07 DIAGNOSIS — Y9389 Activity, other specified: Secondary | ICD-10-CM | POA: Insufficient documentation

## 2015-02-07 DIAGNOSIS — Y998 Other external cause status: Secondary | ICD-10-CM | POA: Insufficient documentation

## 2015-02-07 DIAGNOSIS — Z8659 Personal history of other mental and behavioral disorders: Secondary | ICD-10-CM | POA: Insufficient documentation

## 2015-02-07 DIAGNOSIS — G43909 Migraine, unspecified, not intractable, without status migrainosus: Secondary | ICD-10-CM | POA: Insufficient documentation

## 2015-02-07 DIAGNOSIS — S46912A Strain of unspecified muscle, fascia and tendon at shoulder and upper arm level, left arm, initial encounter: Secondary | ICD-10-CM | POA: Insufficient documentation

## 2015-02-07 DIAGNOSIS — Z720 Tobacco use: Secondary | ICD-10-CM | POA: Insufficient documentation

## 2015-02-07 DIAGNOSIS — Z791 Long term (current) use of non-steroidal anti-inflammatories (NSAID): Secondary | ICD-10-CM | POA: Insufficient documentation

## 2015-02-07 MED ORDER — DEXAMETHASONE SODIUM PHOSPHATE 10 MG/ML IJ SOLN
INTRAMUSCULAR | Status: AC
  Filled 2015-02-07: qty 1

## 2015-02-07 MED ORDER — DEXAMETHASONE SODIUM PHOSPHATE 10 MG/ML IJ SOLN
10.0000 mg | Freq: Once | INTRAMUSCULAR | Status: AC
  Administered 2015-02-07: 10 mg via INTRAMUSCULAR
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
## 2015-02-07 NOTE — ED Notes (Signed)
Placed pt in a sling. Pt stated that it made is shoulder hurt worse and removed it.

## 2015-02-07 NOTE — Discharge Instructions (Signed)
Wear arm sling as applied for the next several days.  Ibuprofen 600 mg every 6 hours as needed for pain.  Follow-up with a primary care doctor if not improving in the next week to discuss imaging studies or physical therapy.   Muscle Strain A muscle strain is an injury that occurs when a muscle is stretched beyond its normal length. Usually a small number of muscle fibers are torn when this happens. Muscle strain is rated in degrees. First-degree strains have the least amount of muscle fiber tearing and pain. Second-degree and third-degree strains have increasingly more tearing and pain.  Usually, recovery from muscle strain takes 1-2 weeks. Complete healing takes 5-6 weeks.  CAUSES  Muscle strain happens when a sudden, violent force placed on a muscle stretches it too far. This may occur with lifting, sports, or a fall.  RISK FACTORS Muscle strain is especially common in athletes.  SIGNS AND SYMPTOMS At the site of the muscle strain, there may be:  Pain.  Bruising.  Swelling.  Difficulty using the muscle due to pain or lack of normal function. DIAGNOSIS  Your health care provider will perform a physical exam and ask about your medical history. TREATMENT  Often, the best treatment for a muscle strain is resting, icing, and applying cold compresses to the injured area.  HOME CARE INSTRUCTIONS   Use the PRICE method of treatment to promote muscle healing during the first 2-3 days after your injury. The PRICE method involves:  Protecting the muscle from being injured again.  Restricting your activity and resting the injured body part.  Icing your injury. To do this, put ice in a plastic bag. Place a towel between your skin and the bag. Then, apply the ice and leave it on from 15-20 minutes each hour. After the third day, switch to moist heat packs.  Apply compression to the injured area with a splint or elastic bandage. Be careful not to wrap it too tightly. This may interfere  with blood circulation or increase swelling.  Elevate the injured body part above the level of your heart as often as you can.  Only take over-the-counter or prescription medicines for pain, discomfort, or fever as directed by your health care provider.  Warming up prior to exercise helps to prevent future muscle strains. SEEK MEDICAL CARE IF:   You have increasing pain or swelling in the injured area.  You have numbness, tingling, or a significant loss of strength in the injured area. MAKE SURE YOU:   Understand these instructions.  Will watch your condition.  Will get help right away if you are not doing well or get worse.   This information is not intended to replace advice given to you by your health care provider. Make sure you discuss any questions you have with your health care provider.   Document Released: 04/19/2005 Document Revised: 02/07/2013 Document Reviewed: 11/16/2012 Elsevier Interactive Patient Education [employer]

## 2015-02-07 NOTE — ED Notes (Signed)
Pt amb to triage with quick steady gait in nad, reports he was aerating a lawn Wednesday and hit a root, injuring his left shoulder. Pt states he injured this same arm last summer and was seen here, did not follow up due to no insurance.

## 2015-06-11 ENCOUNTER — Encounter (HOSPITAL_BASED_OUTPATIENT_CLINIC_OR_DEPARTMENT_OTHER): Payer: Self-pay

## 2015-06-11 ENCOUNTER — Emergency Department (HOSPITAL_BASED_OUTPATIENT_CLINIC_OR_DEPARTMENT_OTHER): Payer: No Typology Code available for payment source

## 2015-06-11 ENCOUNTER — Emergency Department (HOSPITAL_BASED_OUTPATIENT_CLINIC_OR_DEPARTMENT_OTHER)
Admission: EM | Admit: 2015-06-11 | Discharge: 2015-06-11 | Disposition: A | Discharge status: Home / Self Care | Payer: No Typology Code available for payment source | Attending: Emergency Medicine | Admitting: Emergency Medicine

## 2015-06-11 ENCOUNTER — Emergency Department (HOSPITAL_BASED_OUTPATIENT_CLINIC_OR_DEPARTMENT_OTHER): Payer: Self-pay

## 2015-06-11 DIAGNOSIS — M25531 Pain in right wrist: Secondary | ICD-10-CM

## 2015-06-11 DIAGNOSIS — S6991XA Unspecified injury of right wrist, hand and finger(s), initial encounter: Secondary | ICD-10-CM | POA: Insufficient documentation

## 2015-06-11 DIAGNOSIS — F419 Anxiety disorder, unspecified: Secondary | ICD-10-CM | POA: Insufficient documentation

## 2015-06-11 DIAGNOSIS — Y998 Other external cause status: Secondary | ICD-10-CM | POA: Insufficient documentation

## 2015-06-11 DIAGNOSIS — Y9389 Activity, other specified: Secondary | ICD-10-CM | POA: Insufficient documentation

## 2015-06-11 DIAGNOSIS — Y9289 Other specified places as the place of occurrence of the external cause: Secondary | ICD-10-CM | POA: Insufficient documentation

## 2015-06-11 DIAGNOSIS — R000 Tachycardia, unspecified: Secondary | ICD-10-CM | POA: Insufficient documentation

## 2015-06-11 DIAGNOSIS — F17210 Nicotine dependence, cigarettes, uncomplicated: Secondary | ICD-10-CM | POA: Insufficient documentation

## 2015-06-11 DIAGNOSIS — W28XXXA Contact with powered lawn mower, initial encounter: Secondary | ICD-10-CM | POA: Insufficient documentation

## 2015-06-11 DIAGNOSIS — Z8679 Personal history of other diseases of the circulatory system: Secondary | ICD-10-CM | POA: Insufficient documentation

## 2015-06-11 MED ORDER — TRAMADOL HCL 50 MG PO TABS
50.0000 mg | ORAL_TABLET | Freq: Once | ORAL | Status: AC
  Administered 2015-06-11: 50 mg via ORAL
  Filled 2015-06-11: qty 1

## 2015-06-11 MED ORDER — IBUPROFEN 400 MG PO TABS
600.0000 mg | ORAL_TABLET | Freq: Once | ORAL | Status: AC
  Administered 2015-06-11: 600 mg via ORAL
  Filled 2015-06-11: qty 1

## 2015-06-11 MED ORDER — TRAMADOL HCL 50 MG PO TABS
50.0000 mg | ORAL_TABLET | Freq: Four times a day (QID) | ORAL | Status: AC | PRN
Start: 2015-06-11 — End: ?

## 2015-06-11 MED ORDER — IBUPROFEN 600 MG PO TABS
600.0000 mg | ORAL_TABLET | Freq: Four times a day (QID) | ORAL | Status: AC | PRN
Start: 2015-06-11 — End: ?
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
## 2015-06-11 NOTE — ED Notes (Signed)
Patient transported to X-ray 
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
## 2015-06-11 NOTE — Discharge Instructions (Signed)
Wrist Pain There are many things that can cause wrist pain. Some common causes include:  An injury to the wrist area, such as a sprain, strain, or fracture.  Overuse of the joint.  A condition that causes increased pressure on a nerve in the wrist (carpal tunnel syndrome).  Wear and tear of the joints that occurs with aging (osteoarthritis).  A variety of other types of arthritis. Sometimes, the cause of wrist pain is not known. The pain often goes away when you follow your health care provider's instructions for relieving pain at home. If your wrist pain continues, tests may need to be done to diagnose your condition. HOME CARE INSTRUCTIONS Pay attention to any changes in your symptoms. Take these actions to help with your pain:  Rest the wrist area for at least 48 hours or as told by your health care provider.  If directed, apply ice to the injured area:  Put ice in a plastic bag.  Place a towel between your skin and the bag.  Leave the ice on for 20 minutes, 2-3 times per day.  Keep your arm raised (elevated) above the level of your heart while you are sitting or lying down.  If a splint or elastic bandage has been applied, use it as told by your health care provider.  Remove the splint or bandage only as told by your health care provider.  Loosen the splint or bandage if your fingers become numb or have a tingling feeling, or if they turn cold or blue.  Take over-the-counter and prescription medicines only as told by your health care provider.  Keep all follow-up visits as told by your health care provider. This is important. SEEK MEDICAL CARE IF:  Your pain is not helped by treatment.  Your pain gets worse. SEEK IMMEDIATE MEDICAL CARE IF:  Your fingers become swollen.  Your fingers turn white, very red, or cold and blue.  Your fingers are numb or have a tingling feeling.  You have difficulty moving your fingers.   This information is not intended to replace  advice given to you by your health care provider. Make sure you discuss any questions you have with your health care provider.   Document Released: 01/27/2005 Document Revised: 01/08/2015 Document Reviewed: 09/04/2014 Elsevier Interactive Patient Education 2016 [employer]  

## 2015-07-22 ENCOUNTER — Emergency Department (HOSPITAL_COMMUNITY)
Admission: EM | Admit: 2015-07-22 | Discharge: 2015-07-22 | Disposition: A | Discharge status: Home / Self Care | Payer: Self-pay | Attending: Emergency Medicine | Admitting: Emergency Medicine

## 2015-07-22 ENCOUNTER — Emergency Department (HOSPITAL_COMMUNITY): Payer: Self-pay

## 2015-07-22 ENCOUNTER — Encounter (HOSPITAL_COMMUNITY): Payer: Self-pay | Admitting: Emergency Medicine

## 2015-07-22 DIAGNOSIS — F329 Major depressive disorder, single episode, unspecified: Secondary | ICD-10-CM | POA: Insufficient documentation

## 2015-07-22 DIAGNOSIS — S30811A Abrasion of abdominal wall, initial encounter: Secondary | ICD-10-CM | POA: Insufficient documentation

## 2015-07-22 DIAGNOSIS — Y999 Unspecified external cause status: Secondary | ICD-10-CM | POA: Insufficient documentation

## 2015-07-22 DIAGNOSIS — T07XXXA Unspecified multiple injuries, initial encounter: Secondary | ICD-10-CM

## 2015-07-22 DIAGNOSIS — S20211A Contusion of right front wall of thorax, initial encounter: Secondary | ICD-10-CM | POA: Insufficient documentation

## 2015-07-22 DIAGNOSIS — Y929 Unspecified place or not applicable: Secondary | ICD-10-CM | POA: Insufficient documentation

## 2015-07-22 DIAGNOSIS — S1093XA Contusion of unspecified part of neck, initial encounter: Secondary | ICD-10-CM | POA: Insufficient documentation

## 2015-07-22 DIAGNOSIS — R079 Chest pain, unspecified: Secondary | ICD-10-CM

## 2015-07-22 DIAGNOSIS — F17210 Nicotine dependence, cigarettes, uncomplicated: Secondary | ICD-10-CM | POA: Insufficient documentation

## 2015-07-22 DIAGNOSIS — Y9389 Activity, other specified: Secondary | ICD-10-CM | POA: Insufficient documentation

## 2015-07-22 LAB — CBC WITH DIFFERENTIAL/PLATELET
Basophils Absolute: 0 10*3/uL (ref 0.0–0.1)
Basophils Relative: 0 %
EOS PCT: 3 %
Eosinophils Absolute: 0.3 10*3/uL (ref 0.0–0.7)
HCT: 43.6 % (ref 39.0–52.0)
Hemoglobin: 14.6 g/dL (ref 13.0–17.0)
Lymphocytes Relative: 37 %
Lymphs Abs: 3.4 10*3/uL (ref 0.7–4.0)
MCH: 31.1 pg (ref 26.0–34.0)
MCHC: 33.5 g/dL (ref 30.0–36.0)
MCV: 93 fL (ref 78.0–100.0)
Monocytes Absolute: 0.7 10*3/uL (ref 0.1–1.0)
Monocytes Relative: 7 %
Neutro Abs: 4.9 10*3/uL (ref 1.7–7.7)
Neutrophils Relative %: 53 %
PLATELETS: 313 10*3/uL (ref 150–400)
RBC: 4.69 MIL/uL (ref 4.22–5.81)
RDW: 13.2 % (ref 11.5–15.5)
WBC: 9.3 10*3/uL (ref 4.0–10.5)

## 2015-07-22 LAB — COMPREHENSIVE METABOLIC PANEL
ALBUMIN: 4.2 g/dL (ref 3.5–5.0)
ALT: 21 U/L (ref 17–63)
AST: 24 U/L (ref 15–41)
Alkaline Phosphatase: 48 U/L (ref 38–126)
Anion gap: 7 (ref 5–15)
BUN: 12 mg/dL (ref 6–20)
CO2: 29 mmol/L (ref 22–32)
Calcium: 9.6 mg/dL (ref 8.9–10.3)
Chloride: 106 mmol/L (ref 101–111)
Creatinine, Ser: 0.86 mg/dL (ref 0.61–1.24)
GFR calc Af Amer: >60 mL/min (ref >60)
GLUCOSE: 96 mg/dL (ref 65–99)
Potassium: 3.8 mmol/L (ref 3.5–5.1)
SODIUM: 142 mmol/L (ref 135–145)
Total Bilirubin: 0.9 mg/dL (ref 0.3–1.2)
Total Protein: 7.3 g/dL (ref 6.5–8.1)

## 2015-07-22 LAB — URINALYSIS, ROUTINE W REFLEX MICROSCOPIC
Bilirubin Urine: NEGATIVE
GLUCOSE, UA: NEGATIVE mg/dL
Hgb urine dipstick: NEGATIVE
Ketones, ur: NEGATIVE mg/dL
Leukocytes, UA: NEGATIVE
Nitrite: NEGATIVE
PH: 7.5 (ref 5.0–8.0)
Protein, ur: NEGATIVE mg/dL
Specific Gravity, Urine: 1.005 (ref 1.005–1.030)

## 2015-07-22 LAB — ETHANOL: Alcohol, Ethyl (B): <5 mg/dL (ref <5)

## 2015-07-22 LAB — RAPID URINE DRUG SCREEN, HOSP PERFORMED
AMPHETAMINES: NOT DETECTED
BARBITURATES: NOT DETECTED
BENZODIAZEPINES: POSITIVE — AB
Cocaine: POSITIVE — AB
Opiates: NOT DETECTED
TETRAHYDROCANNABINOL: POSITIVE — AB

## 2015-07-22 MED ORDER — FENTANYL CITRATE (PF) 100 MCG/2ML IJ SOLN
50.0000 ug | Freq: Once | INTRAMUSCULAR | Status: AC
  Administered 2015-07-22: 50 ug via INTRAVENOUS

## 2015-07-22 MED ORDER — SODIUM CHLORIDE 0.9 % IV BOLUS (SEPSIS)
1000.0000 mL | Freq: Once | INTRAVENOUS | Status: AC
  Administered 2015-07-22: 1000 mL via INTRAVENOUS

## 2015-07-22 MED ORDER — ONDANSETRON HCL 4 MG/2ML IJ SOLN
4.0000 mg | Freq: Once | INTRAMUSCULAR | Status: AC
  Administered 2015-07-22: 4 mg via INTRAVENOUS
  Filled 2015-07-22: qty 2

## 2015-07-22 MED ORDER — CYCLOBENZAPRINE HCL 10 MG PO TABS: 10.0000 mg | ORAL_TABLET | Freq: Three times a day (TID) | ORAL | Status: AC | PRN

## 2015-07-22 MED ORDER — IOHEXOL 300 MG/ML  SOLN
100.0000 mL | Freq: Once | INTRAMUSCULAR | Status: AC | PRN
  Administered 2015-07-22: 100 mL via INTRAVENOUS

## 2015-07-22 MED ORDER — NAPROXEN 500 MG PO TABS: ORAL_TABLET | ORAL | Status: AC

## 2015-07-22 MED ORDER — FENTANYL CITRATE (PF) 100 MCG/2ML IJ SOLN
50.0000 ug | Freq: Once | INTRAMUSCULAR | Status: AC
  Administered 2015-07-22: 50 ug via INTRAVENOUS
  Filled 2015-07-22: qty 2

## 2015-07-22 MED ORDER — FENTANYL CITRATE (PF) 100 MCG/2ML IJ SOLN
INTRAMUSCULAR | Status: AC
  Filled 2015-07-22: qty 2

## 2015-07-22 MED ORDER — KETOROLAC TROMETHAMINE 30 MG/ML IJ SOLN
30.0000 mg | Freq: Once | INTRAMUSCULAR | Status: AC
  Administered 2015-07-22: 30 mg via INTRAVENOUS

## 2015-07-22 NOTE — Discharge Instructions (Signed)
Use ice and heat on the painful areas. Keep the abrasions clean and dry. Do use triple antibiotic ointment on the areas.  Take the medications as prescribed.  Return to the ED for any problems listed on the head injury sheet, if you struggle to breathe, start vomiting blood or seem worse. You can be rechecked by Dr Romeo AppleHarrison, the orthopedist on call, if you continue to have pain and numbness in your leg. Use the crutches until you are able to walk on the leg.
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
## 2015-07-22 NOTE — ED Notes (Signed)
Pt refused crutches.

## 2015-07-22 NOTE — ED Notes (Signed)
After consulting with Dr Lynelle DoctorKnapp, pt is told that Dr Lynelle DoctorKnapp wants him to have an IV for testing. Pt again refuses and states that should he have an IV, he will have a panic attack. Pt educated that he need IV access for CT and he again refuses. Dr Lynelle DoctorKnapp informed.

## 2015-07-22 NOTE — ED Notes (Signed)
Return from xray

## 2015-07-22 NOTE — ED Notes (Signed)
Pt states understanding of care given and follow up instructions.  Pt states "I just came here for a second opinion.  I don't understand how [facility] told me I had a bulging disk and pinched nerve and ya'll are telling me nothing is wrong".  Pt refused crutches, states that he cannot take Naproxyn because it makes him vomit but states he does not want to wait to discuss different medication with Dr Lynelle DoctorKnapp.  Pt ambulated from the ED angry with significant other.

## 2015-07-22 NOTE — ED Notes (Signed)
Pt complains of increasing pain and of IV site pain. VO received for fent 50 mcg
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
